# Patient Record
Sex: Female | Born: 1945 | Race: White | Hispanic: No | Marital: Single | Smoking: Former smoker
Health system: Southern US, Community
[De-identification: ages and names within clinical notes are randomized; demographics above are authoritative.]

## PROBLEM LIST (undated history)

## (undated) DIAGNOSIS — J449 Chronic obstructive pulmonary disease, unspecified: Secondary | ICD-10-CM

## (undated) DIAGNOSIS — J45909 Unspecified asthma, uncomplicated: Secondary | ICD-10-CM

---

## 2019-01-11 ENCOUNTER — Other Ambulatory Visit: Payer: Self-pay

## 2019-01-11 DIAGNOSIS — Z20822 Contact with and (suspected) exposure to covid-19: Secondary | ICD-10-CM

## 2019-01-13 LAB — NOVEL CORONAVIRUS, NAA: SARS-CoV-2, NAA: NOT DETECTED

## 2019-01-19 ENCOUNTER — Inpatient Hospital Stay (HOSPITAL_COMMUNITY)
Admission: EM | Admit: 2019-01-19 | Discharge: 2019-01-31 | DRG: 177 | Disposition: E | Payer: Medicare Other | Attending: Internal Medicine | Admitting: Internal Medicine

## 2019-01-19 ENCOUNTER — Emergency Department (HOSPITAL_COMMUNITY): Payer: Medicare Other

## 2019-01-19 ENCOUNTER — Encounter (HOSPITAL_COMMUNITY): Payer: Self-pay

## 2019-01-19 DIAGNOSIS — J431 Panlobular emphysema: Secondary | ICD-10-CM | POA: Diagnosis not present

## 2019-01-19 DIAGNOSIS — J690 Pneumonitis due to inhalation of food and vomit: Secondary | ICD-10-CM | POA: Diagnosis present

## 2019-01-19 DIAGNOSIS — I4891 Unspecified atrial fibrillation: Secondary | ICD-10-CM | POA: Diagnosis present

## 2019-01-19 DIAGNOSIS — J1282 Pneumonia due to coronavirus disease 2019: Secondary | ICD-10-CM | POA: Diagnosis present

## 2019-01-19 DIAGNOSIS — Z681 Body mass index (BMI) 19 or less, adult: Secondary | ICD-10-CM | POA: Diagnosis not present

## 2019-01-19 DIAGNOSIS — J9601 Acute respiratory failure with hypoxia: Secondary | ICD-10-CM | POA: Diagnosis not present

## 2019-01-19 DIAGNOSIS — G9341 Metabolic encephalopathy: Secondary | ICD-10-CM | POA: Diagnosis not present

## 2019-01-19 DIAGNOSIS — Z66 Do not resuscitate: Secondary | ICD-10-CM | POA: Diagnosis not present

## 2019-01-19 DIAGNOSIS — Z8249 Family history of ischemic heart disease and other diseases of the circulatory system: Secondary | ICD-10-CM | POA: Diagnosis not present

## 2019-01-19 DIAGNOSIS — R0602 Shortness of breath: Secondary | ICD-10-CM | POA: Diagnosis present

## 2019-01-19 DIAGNOSIS — Z87891 Personal history of nicotine dependence: Secondary | ICD-10-CM | POA: Diagnosis not present

## 2019-01-19 DIAGNOSIS — J069 Acute upper respiratory infection, unspecified: Secondary | ICD-10-CM | POA: Diagnosis not present

## 2019-01-19 DIAGNOSIS — E860 Dehydration: Secondary | ICD-10-CM | POA: Diagnosis present

## 2019-01-19 DIAGNOSIS — U071 COVID-19: Secondary | ICD-10-CM | POA: Diagnosis present

## 2019-01-19 DIAGNOSIS — J430 Unilateral pulmonary emphysema [MacLeod's syndrome]: Secondary | ICD-10-CM | POA: Diagnosis not present

## 2019-01-19 DIAGNOSIS — E876 Hypokalemia: Secondary | ICD-10-CM | POA: Diagnosis not present

## 2019-01-19 DIAGNOSIS — E440 Moderate protein-calorie malnutrition: Secondary | ICD-10-CM | POA: Diagnosis present

## 2019-01-19 DIAGNOSIS — Z515 Encounter for palliative care: Secondary | ICD-10-CM | POA: Diagnosis not present

## 2019-01-19 DIAGNOSIS — R7303 Prediabetes: Secondary | ICD-10-CM | POA: Diagnosis present

## 2019-01-19 DIAGNOSIS — J449 Chronic obstructive pulmonary disease, unspecified: Secondary | ICD-10-CM | POA: Diagnosis not present

## 2019-01-19 DIAGNOSIS — R0902 Hypoxemia: Secondary | ICD-10-CM

## 2019-01-19 DIAGNOSIS — B370 Candidal stomatitis: Secondary | ICD-10-CM | POA: Diagnosis present

## 2019-01-19 DIAGNOSIS — J1289 Other viral pneumonia: Secondary | ICD-10-CM | POA: Diagnosis present

## 2019-01-19 DIAGNOSIS — J80 Acute respiratory distress syndrome: Secondary | ICD-10-CM | POA: Diagnosis not present

## 2019-01-19 DIAGNOSIS — G92 Toxic encephalopathy: Secondary | ICD-10-CM | POA: Diagnosis not present

## 2019-01-19 DIAGNOSIS — J440 Chronic obstructive pulmonary disease with acute lower respiratory infection: Secondary | ICD-10-CM | POA: Diagnosis present

## 2019-01-19 DIAGNOSIS — E7800 Pure hypercholesterolemia, unspecified: Secondary | ICD-10-CM | POA: Diagnosis not present

## 2019-01-19 DIAGNOSIS — E785 Hyperlipidemia, unspecified: Secondary | ICD-10-CM | POA: Diagnosis present

## 2019-01-19 DIAGNOSIS — G928 Other toxic encephalopathy: Secondary | ICD-10-CM | POA: Diagnosis present

## 2019-01-19 DIAGNOSIS — Z825 Family history of asthma and other chronic lower respiratory diseases: Secondary | ICD-10-CM

## 2019-01-19 DIAGNOSIS — E870 Hyperosmolality and hypernatremia: Secondary | ICD-10-CM | POA: Diagnosis not present

## 2019-01-19 DIAGNOSIS — E872 Acidosis: Secondary | ICD-10-CM | POA: Diagnosis present

## 2019-01-19 DIAGNOSIS — R0600 Dyspnea, unspecified: Secondary | ICD-10-CM

## 2019-01-19 DIAGNOSIS — E871 Hypo-osmolality and hyponatremia: Secondary | ICD-10-CM | POA: Diagnosis present

## 2019-01-19 HISTORY — DX: Unspecified asthma, uncomplicated: J45.909

## 2019-01-19 HISTORY — DX: Chronic obstructive pulmonary disease, unspecified: J44.9

## 2019-01-19 LAB — CBC WITH DIFFERENTIAL/PLATELET
Abs Immature Granulocytes: 0.08 10*3/uL — ABNORMAL HIGH (ref 0.00–0.07)
Basophils Absolute: 0 10*3/uL (ref 0.0–0.1)
Basophils Relative: 0 %
Eosinophils Absolute: 0 10*3/uL (ref 0.0–0.5)
Eosinophils Relative: 0 %
HCT: 41.9 % (ref 36.0–46.0)
Hemoglobin: 14.1 g/dL (ref 12.0–15.0)
Immature Granulocytes: 1 %
Lymphocytes Relative: 11 %
Lymphs Abs: 1 10*3/uL (ref 0.7–4.0)
MCH: 31.1 pg (ref 26.0–34.0)
MCHC: 33.7 g/dL (ref 30.0–36.0)
MCV: 92.3 fL (ref 80.0–100.0)
Monocytes Absolute: 0.3 10*3/uL (ref 0.1–1.0)
Monocytes Relative: 4 %
Neutro Abs: 7.6 10*3/uL (ref 1.7–7.7)
Neutrophils Relative %: 84 %
Platelets: 180 10*3/uL (ref 150–400)
RBC: 4.54 MIL/uL (ref 3.87–5.11)
RDW: 14.6 % (ref 11.5–15.5)
WBC: 9 10*3/uL (ref 4.0–10.5)
nRBC: 0 % (ref 0.0–0.2)

## 2019-01-19 LAB — BLOOD GAS, ARTERIAL
Acid-base deficit: 8.3 mmol/L — ABNORMAL HIGH (ref 0.0–2.0)
Bicarbonate: 15.6 mmol/L — ABNORMAL LOW (ref 20.0–28.0)
O2 Saturation: 85.7 %
Patient temperature: 97.7
pCO2 arterial: 27.9 mmHg — ABNORMAL LOW (ref 32.0–48.0)
pH, Arterial: 7.364 (ref 7.350–7.450)
pO2, Arterial: 53.4 mmHg — ABNORMAL LOW (ref 83.0–108.0)

## 2019-01-19 LAB — FIBRINOGEN: Fibrinogen: 528 mg/dL — ABNORMAL HIGH (ref 210–475)

## 2019-01-19 LAB — COMPREHENSIVE METABOLIC PANEL
ALT: 63 U/L — ABNORMAL HIGH (ref 0–44)
AST: 83 U/L — ABNORMAL HIGH (ref 15–41)
Albumin: 3 g/dL — ABNORMAL LOW (ref 3.5–5.0)
Alkaline Phosphatase: 79 U/L (ref 38–126)
Anion gap: 14 (ref 5–15)
BUN: 8 mg/dL (ref 8–23)
CO2: 17 mmol/L — ABNORMAL LOW (ref 22–32)
Calcium: 8.4 mg/dL — ABNORMAL LOW (ref 8.9–10.3)
Chloride: 102 mmol/L (ref 98–111)
Creatinine, Ser: 0.92 mg/dL (ref 0.44–1.00)
GFR calc Af Amer: >60 mL/min (ref >60)
GFR calc non Af Amer: >60 mL/min (ref >60)
Glucose, Bld: 103 mg/dL — ABNORMAL HIGH (ref 70–99)
Potassium: 4 mmol/L (ref 3.5–5.1)
Sodium: 133 mmol/L — ABNORMAL LOW (ref 135–145)
Total Bilirubin: 0.5 mg/dL (ref 0.3–1.2)
Total Protein: 6.6 g/dL (ref 6.5–8.1)

## 2019-01-19 LAB — C-REACTIVE PROTEIN: CRP: 10.9 mg/dL — ABNORMAL HIGH (ref <1.0)

## 2019-01-19 LAB — D-DIMER, QUANTITATIVE: D-Dimer, Quant: 1.15 ug/mL-FEU — ABNORMAL HIGH (ref 0.00–0.50)

## 2019-01-19 LAB — FERRITIN: Ferritin: 777 ng/mL — ABNORMAL HIGH (ref 11–307)

## 2019-01-19 LAB — LACTIC ACID, PLASMA
Lactic Acid, Venous: 1.6 mmol/L (ref 0.5–1.9)
Lactic Acid, Venous: 2 mmol/L (ref 0.5–1.9)

## 2019-01-19 LAB — TRIGLYCERIDES: Triglycerides: 119 mg/dL (ref <150)

## 2019-01-19 LAB — PROCALCITONIN: Procalcitonin: 1.21 ng/mL

## 2019-01-19 LAB — LACTATE DEHYDROGENASE: LDH: 386 U/L — ABNORMAL HIGH (ref 98–192)

## 2019-01-19 LAB — TROPONIN I (HIGH SENSITIVITY): Troponin I (High Sensitivity): 9 ng/L (ref <18)

## 2019-01-19 MED ORDER — SODIUM CHLORIDE 0.9 % IV SOLN
1000.0000 mL | INTRAVENOUS | Status: DC
  Administered 2019-01-19: 1000 mL via INTRAVENOUS

## 2019-01-19 MED ORDER — DEXAMETHASONE SODIUM PHOSPHATE 10 MG/ML IJ SOLN
10.0000 mg | Freq: Once | INTRAMUSCULAR | Status: AC
  Administered 2019-01-19: 10 mg via INTRAVENOUS
  Filled 2019-01-19: qty 1

## 2019-01-19 MED ORDER — UMECLIDINIUM BROMIDE 62.5 MCG/INH IN AEPB
1.0000 | INHALATION_SPRAY | Freq: Every day | RESPIRATORY_TRACT | Status: DC
  Administered 2019-01-20: 1 via RESPIRATORY_TRACT
  Filled 2019-01-19: qty 7

## 2019-01-19 MED ORDER — MONTELUKAST SODIUM 10 MG PO TABS
10.0000 mg | ORAL_TABLET | Freq: Every day | ORAL | Status: DC
  Administered 2019-01-20 – 2019-01-22 (×3): 10 mg via ORAL
  Filled 2019-01-19 (×4): qty 1

## 2019-01-19 MED ORDER — GUAIFENESIN-DM 100-10 MG/5ML PO SYRP: 10.0000 mL | ORAL_SOLUTION | ORAL | Status: DC | PRN

## 2019-01-19 MED ORDER — NYSTATIN 100000 UNIT/ML MT SUSP: 15.0000 mL | OROMUCOSAL | Status: DC

## 2019-01-19 MED ORDER — ATORVASTATIN CALCIUM 40 MG PO TABS
40.0000 mg | ORAL_TABLET | Freq: Every day | ORAL | Status: DC
  Administered 2019-01-20 – 2019-01-22 (×3): 40 mg via ORAL
  Filled 2019-01-19 (×3): qty 1

## 2019-01-19 MED ORDER — FLUTICASONE FUROATE-VILANTEROL 200-25 MCG/INH IN AEPB
1.0000 | INHALATION_SPRAY | Freq: Every day | RESPIRATORY_TRACT | Status: DC
  Administered 2019-01-20 – 2019-01-28 (×4): 1 via RESPIRATORY_TRACT
  Filled 2019-01-19: qty 28

## 2019-01-19 MED ORDER — DEXAMETHASONE SODIUM PHOSPHATE 10 MG/ML IJ SOLN
6.0000 mg | INTRAMUSCULAR | Status: DC
  Administered 2019-01-20 – 2019-01-22 (×3): 6 mg via INTRAVENOUS
  Filled 2019-01-19 (×3): qty 1

## 2019-01-19 MED ORDER — SODIUM CHLORIDE 0.9 % IV SOLN
2.0000 g | INTRAVENOUS | Status: DC
  Administered 2019-01-19 – 2019-01-22 (×4): 2 g via INTRAVENOUS
  Filled 2019-01-19 (×4): qty 20

## 2019-01-19 MED ORDER — ALBUTEROL SULFATE HFA 108 (90 BASE) MCG/ACT IN AERS
2.0000 | INHALATION_SPRAY | RESPIRATORY_TRACT | Status: DC | PRN
  Administered 2019-01-20: 4 via RESPIRATORY_TRACT
  Administered 2019-01-21 (×2): 2 via RESPIRATORY_TRACT
  Filled 2019-01-19: qty 6.7

## 2019-01-19 MED ORDER — ZINC SULFATE 220 (50 ZN) MG PO CAPS
220.0000 mg | ORAL_CAPSULE | Freq: Every day | ORAL | Status: DC
  Administered 2019-01-20 – 2019-01-29 (×7): 220 mg via ORAL
  Filled 2019-01-19 (×8): qty 1

## 2019-01-19 MED ORDER — VITAMIN C 500 MG PO TABS
500.0000 mg | ORAL_TABLET | Freq: Every day | ORAL | Status: DC
  Administered 2019-01-20 – 2019-01-29 (×7): 500 mg via ORAL
  Filled 2019-01-19 (×8): qty 1

## 2019-01-19 MED ORDER — ENOXAPARIN SODIUM 40 MG/0.4ML ~~LOC~~ SOLN
40.0000 mg | Freq: Every day | SUBCUTANEOUS | Status: DC
  Administered 2019-01-20 – 2019-01-21 (×2): 40 mg via SUBCUTANEOUS
  Filled 2019-01-19 (×3): qty 0.4

## 2019-01-19 MED ORDER — ACETAMINOPHEN 325 MG PO TABS: 650.0000 mg | ORAL_TABLET | Freq: Four times a day (QID) | ORAL | Status: DC | PRN

## 2019-01-19 MED ORDER — HYDROCOD POLST-CPM POLST ER 10-8 MG/5ML PO SUER
5.0000 mL | Freq: Two times a day (BID) | ORAL | Status: DC | PRN
  Administered 2019-01-21: 5 mL via ORAL
  Filled 2019-01-19: qty 5

## 2019-01-19 MED ORDER — TIOTROPIUM BROMIDE MONOHYDRATE 2.5 MCG/ACT IN AERS: 2.0000 | INHALATION_SPRAY | Freq: Every day | RESPIRATORY_TRACT | Status: DC

## 2019-01-19 MED ORDER — ALBUTEROL SULFATE HFA 108 (90 BASE) MCG/ACT IN AERS
4.0000 | INHALATION_SPRAY | Freq: Once | RESPIRATORY_TRACT | Status: AC
  Administered 2019-01-19: 4 via RESPIRATORY_TRACT
  Filled 2019-01-19: qty 6.7

## 2019-01-19 MED ORDER — SODIUM CHLORIDE 0.9 % IV SOLN
500.0000 mg | INTRAVENOUS | Status: DC
  Administered 2019-01-19 – 2019-01-20 (×2): 500 mg via INTRAVENOUS
  Filled 2019-01-19 (×2): qty 500

## 2019-01-19 MED ORDER — SODIUM CHLORIDE 0.9 % IV BOLUS
500.0000 mL | Freq: Once | INTRAVENOUS | Status: AC
  Administered 2019-01-19: 500 mL via INTRAVENOUS

## 2019-01-19 NOTE — ED Notes (Signed)
Pt daughter called for an update, daughter stated pt has been taking spiriva and albuterol for COVID symptoms and has oral thrush. Daughter stated she has been using mouthwash to treat thrush.

## 2019-01-19 NOTE — ED Triage Notes (Signed)
Per EMS: Pt from home.  Pt tested positive for COVID on Sunday.  Pt c/o of SOB x2 days. Pt has had cough since Sunday.  Pt hx of COPD and asthma.  Daughter states pt has been more confused.  Pt satting 74% RA on arrival.  Pt placed on non-rebreather.  Pt now 90-93%.   BP 131/53 HR 78 RR 32 O2 95% non-rebreather CBG 156

## 2019-01-19 NOTE — ED Notes (Signed)
Spoke with Dr. Marlowe Sax, verifying Lactic of 2.0.

## 2019-01-19 NOTE — H&P (Addendum)
History and Physical    Ashley Cook MGQ:[id number] DOB: Aug 24, 1945 DOA: 01/27/2019  PCP: System, Pcp Not In Patient coming from: Home  Chief Complaint: COVID-19 positive  HPI: Ashley Cook is a 73 y.o. female with medical history significant of asthma, COPD presenting to the hospital with complaints of shortness of breath and cough.  Patient tested positive for COVID-19 on 11/15.  Complaining of shortness of breath, cough, fatigue, and body aches.  She is not sure when the symptoms started.  Denies chest pain, nausea, vomiting, abdominal pain, or diarrhea.  No additional history could be obtained from her.  ED Course: Oxygen saturation 74% on room air on arrival, improved with 15 L oxygen via nonrebreather.  Tachypneic with respiratory rate up to 30s.  Afebrile and no leukocytosis.  Lactic acid normal.  Bicarb 17.  AST 83, ALT 63.  Alk phos and T bili normal.  Blood culture x2 pending.  Inflammatory markers elevated: Ferritin 777, CRP 10.9, D-dimer 1.15, LDH 386, fibrinogen 528.  Procalcitonin 1.21.  Chest x-ray showing multifocal airspace opacities concerning for COVID-19 pneumonia.  EKG showing diffuse T wave inversions, no prior tracing for comparison. Patient received albuterol MDI treatment, Decadron 10 mg, ceftriaxone, azithromycin, and IV fluid.  Review of Systems:  All systems reviewed and apart from history of presenting illness, are negative.  Past Medical History:  Diagnosis Date  . Asthma   . COPD (chronic obstructive pulmonary disease) ([place])     History reviewed. No pertinent surgical history.   reports that she has quit smoking. Her smoking use included cigarettes. She quit after 25.00 years of use. She has never used smokeless tobacco. She reports that she does not drink alcohol or use drugs.  No Known Allergies  Family History  Problem Relation Age of Onset  . Asthma Mother   . Heart disease Father     Prior to Admission medications   Not on File     Physical Exam: Vitals:   01/20/19 0530 01/20/19 0600 01/20/19 0630 01/20/19 0700  BP: 127/61 126/64 (!) 131/52 (!) 135/59  Pulse: 76 71 70 73  Resp: (!) 28 (!) 28 (!) 23 (!) 23  Temp:      TempSrc:      SpO2: 96% 96% 98% 98%  Weight:      Height:        Physical Exam  Constitutional: She is oriented to person, place, and time. She appears well-developed and well-nourished. No distress.  HENT:  Head: Normocephalic.  Eyes: Right eye exhibits no discharge. Left eye exhibits no discharge.  Neck: Neck supple.  Cardiovascular: Normal rate, regular rhythm and intact distal pulses.  Pulmonary/Chest: She is in respiratory distress. She has no wheezes. She has no rales.  Slightly tachypneic Oxygen saturation 90-92% on 6 L supplemental oxygen via nasal cannula  Abdominal: Soft. Bowel sounds are normal. She exhibits no distension. There is no abdominal tenderness. There is no guarding.  Musculoskeletal:        General: No edema.  Neurological: She is alert and oriented to person, place, and time.  Skin: Skin is warm and dry. She is not diaphoretic.     Labs on Admission: I have personally reviewed following labs and imaging studies  CBC: Recent Labs  Lab 01/08/2019 1938 01/20/19 0511  WBC 9.0 10.3  NEUTROABS 7.6 9.3*  HGB 14.1 12.7  HCT 41.9 38.3  MCV 92.3 92.3  PLT 180 267   Basic Metabolic Panel: Recent Labs  Lab 01/18/2019 1938 01/20/19  0511  NA 133* 139  K 4.0 4.7  CL 102 110  CO2 17* 16*  GLUCOSE 103* 168*  BUN 8 7*  CREATININE 0.92 0.83  CALCIUM 8.4* 7.8*   GFR: Estimated Creatinine Clearance: 44.7 mL/min (by C-G formula based on SCr of 0.83 mg/dL). Liver Function Tests: Recent Labs  Lab 01/07/2019 1938 01/20/19 0511  AST 83* 66*  ALT 63* 51*  ALKPHOS 79 71  BILITOT 0.5 0.4  PROT 6.6 5.8*  ALBUMIN 3.0* 2.5*   No results for input(s): LIPASE, AMYLASE in the last 168 hours. No results for input(s): AMMONIA in the last 168 hours. Coagulation Profile: No  results for input(s): INR, PROTIME in the last 168 hours. Cardiac Enzymes: No results for input(s): CKTOTAL, CKMB, CKMBINDEX, TROPONINI in the last 168 hours. BNP (last 3 results) No results for input(s): PROBNP in the last 8760 hours. HbA1C: No results for input(s): HGBA1C in the last 72 hours. CBG: No results for input(s): GLUCAP in the last 168 hours. Lipid Profile: Recent Labs    01/01/2019 1934  TRIG 119   Thyroid Function Tests: No results for input(s): TSH, T4TOTAL, FREET4, T3FREE, THYROIDAB in the last 72 hours. Anemia Panel: Recent Labs    01/05/2019 1934  FERRITIN 777*   Urine analysis: No results found for: COLORURINE, APPEARANCEUR, LABSPEC, PHURINE, GLUCOSEU, HGBUR, BILIRUBINUR, KETONESUR, PROTEINUR, UROBILINOGEN, NITRITE, LEUKOCYTESUR  Radiological Exams on Admission: Dg Chest Port 1 View  Result Date: 01/23/2019 CLINICAL DATA:  Shortness breath, positive COVID-19 on Sunday EXAM: PORTABLE CHEST 1 VIEW COMPARISON:  None. FINDINGS: Combined interstitial and airspace opacities most pronounced throughout the right lung and in the left lung base. No pneumothorax. No effusion. The cardiomediastinal contours are unremarkable. No acute osseous or soft tissue abnormality. IMPRESSION: Appearance most concerning for a multifocal pneumonia in the setting of known COVID-19 positivity. Electronically Signed   By: Lovena Le M.D.   On: 01/10/2019 20:11    EKG: Independently reviewed.  Sinus rhythm, diffuse T wave inversions.  No prior tracing for comparison.  Assessment/Plan Principal Problem:   Pneumonia due to COVID-19 virus Active Problems:   Acute respiratory failure with hypoxia (HCC)   HLD (hyperlipidemia)   COPD (chronic obstructive pulmonary disease) (HCC)   Acute hypoxemic respiratory failure secondary to COVID-19 multifocal pneumonia Tested positive for COVID-19 on 11/15.  Oxygen saturation 74% on room air.  Placed on 6 L supplemental oxygen via nasal cannula in the  ED.  ABG reflected moderate hypoxemia.  Placed on 15 L supplemental oxygen via nonrebreather.  Tachypneic with respiratory rate in the 20s to 30s. Afebrile and no leukocytosis.  Lactic acid borderline elevated, suspect related to dehydration as it improved with a small fluid bolus.  Chest x-ray showing multifocal airspace opacities concerning for COVID-19 pneumonia.  Inflammatory markers elevated: Ferritin 777, CRP 10.9, D-dimer 1.15, LDH 386, fibrinogen 528.  Transaminases mildly elevated.?Possible bacterial superinfection -  procalcitonin 1.21. -Remdesivir -IV Decadron 6 mg daily -Continue ceftriaxone and azithromycin -Antitussives as needed -Tylenol as needed -Vitamin C and zinc -Continuous pulse ox -Supplemental oxygen to keep oxygen saturation above 90% -Blood culture x2 pending -Daily CBC with differential, CMP, LDH, CRP, D-dimer -Goal is to maintain euvolemia to net negative fluid balance -Lovenox for VT prophylaxis  Addendum: Repeat ABG with improvement of hypoxemia.  Abnormal EKG EKG showing diffuse T wave inversions, no prior tracing for comparison.  High-sensitivity troponin negative.  Patient denies chest pain.  Asthma, COPD -Stable.  Not wheezing. -Continue home Singulair and inhalers  Hyperlipidemia -Continue statin   DVT prophylaxis: Lovenox Code Status: Patient wishes to be full code. Family Communication: No family available. Disposition Plan: Anticipate discharge after clinical improvement. Consults called: None Admission status: It is my clinical opinion that admission to INPATIENT is reasonable and necessary in this 73 y.o. female . presenting with acute hypoxic respiratory failure secondary to COVID-19 viral pneumonia.  Requiring 15 L oxygen via nonrebreather.  Very high risk of decompensation.  Given the aforementioned, the predictability of an adverse outcome is felt to be significant. I expect that the patient will require at least 2 midnights in the hospital  to treat this condition.   The medical decision making on this patient was of high complexity and the patient is at high risk for clinical deterioration, therefore this is a level 3 visit.  Shela Leff MD [facility] Pager [phone]  If 7PM-7AM, please contact night-coverage [email or web address] Password [facility]  01/20/2019, 7:36 AM

## 2019-01-19 NOTE — ED Notes (Signed)
Date and time results received: Jan 28, 2019 2225 (use smartphrase ".now" to insert current time)  Test: Lactic Acid Critical Value: 2.0  Name of Provider Notified: Marlowe Sax, MD  Orders Received? Or Actions Taken?: Orders Received - See Orders for details

## 2019-01-19 NOTE — ED Provider Notes (Signed)
[facility] DEPT Provider Note   CSN: [id number] Arrival date & time: 01/29/2019  1828     History   Chief Complaint No chief complaint on file.   HPI Ashley Cook is a 73 y.o. female.     73 year old female recently diagnosed with Covid who presents with worsening shortness of breath times several days.  He denies any fever or chills.  Has had diffuse body weakness.  No vomiting or diarrhea.  No leg pain or swelling.  No pleuritic chest pain.  Does have underlying history of COPD due to tobacco use.  Denies using tobacco currently.  Symptoms have been progressively worse and are exertional and better with rest.  No treatment use prior to arrival     No past medical history on file.  There are no active problems to display for this patient.      OB History   No obstetric history on file.      Home Medications    Prior to Admission medications   Not on File    Family History No family history on file.  Social History Social History   Tobacco Use  . Smoking status: Not on file  Substance Use Topics  . Alcohol use: Not on file  . Drug use: Not on file     Allergies   Patient has no allergy information on record.   Review of Systems Review of Systems  All other systems reviewed and are negative.    Physical Exam Updated Vital Signs There were no vitals taken for this visit.  Physical Exam Vitals signs and nursing note reviewed.  Constitutional:      General: She is not in acute distress.    Appearance: Normal appearance. She is well-developed. She is not toxic-appearing.  HENT:     Head: Normocephalic and atraumatic.  Eyes:     General: Lids are normal.     Conjunctiva/sclera: Conjunctivae normal.     Pupils: Pupils are equal, round, and reactive to light.  Neck:     Musculoskeletal: Normal range of motion and neck supple.     Thyroid: No thyroid mass.     Trachea: No tracheal deviation.   Cardiovascular:     Rate and Rhythm: Normal rate and regular rhythm.     Heart sounds: Normal heart sounds. No murmur. No gallop.   Pulmonary:     Effort: Tachypnea and respiratory distress present.     Breath sounds: No stridor. Decreased breath sounds present. No wheezing, rhonchi or rales.  Abdominal:     General: Bowel sounds are normal. There is no distension.     Palpations: Abdomen is soft.     Tenderness: There is no abdominal tenderness. There is no rebound.  Musculoskeletal: Normal range of motion.        General: No tenderness.  Skin:    General: Skin is warm and dry.     Findings: No abrasion or rash.  Neurological:     Mental Status: She is alert and oriented to person, place, and time.     GCS: GCS eye subscore is 4. GCS verbal subscore is 5. GCS motor subscore is 6.     Cranial Nerves: No cranial nerve deficit.     Sensory: No sensory deficit.  Psychiatric:        Speech: Speech normal.        Behavior: Behavior normal.      ED Treatments / Results  Labs (all labs ordered  are listed, but only abnormal results are displayed) Labs Reviewed  CULTURE, BLOOD (ROUTINE X 2)  CULTURE, BLOOD (ROUTINE X 2)  LACTIC ACID, PLASMA  LACTIC ACID, PLASMA  CBC WITH DIFFERENTIAL/PLATELET  COMPREHENSIVE METABOLIC PANEL  D-DIMER, QUANTITATIVE (NOT AT [facility])  PROCALCITONIN  LACTATE DEHYDROGENASE  FERRITIN  TRIGLYCERIDES  FIBRINOGEN  C-REACTIVE PROTEIN    EKG EKG Interpretation  Date/Time:  Thursday January 19 2019 20:16:54 EST Ventricular Rate:  81 PR Interval:    QRS Duration: 95 QT Interval:  384 QTC Calculation: 446 R Axis:   60 Text Interpretation: Sinus rhythm Abnormal T, consider ischemia, anterior leads No old tracing to compare Confirmed by Lorre Nick ([id number]) on 01/06/2019 8:31:50 PM   Radiology No results found.  Procedures Procedures (including critical care time)  Medications Ordered in ED Medications  0.9 %  sodium chloride infusion (has no  administration in time range)     Initial Impression / Assessment and Plan / ED Course  I have reviewed the triage vital signs and the nursing notes.  Pertinent labs & imaging results that were available during my care of the patient were reviewed by me and considered in my medical decision making (see chart for details).        Patient's chest x-ray shows multifocal pneumonia will be started on IV antibiotics.  Will admit to the hospitalist service  Ashley Cook was evaluated in Emergency Department on 01/02/2019 for the symptoms described in the history of present illness. She was evaluated in the context of the global COVID-19 pandemic, which necessitated consideration that the patient might be at risk for infection with the SARS-CoV-2 virus that causes COVID-19. Institutional protocols and algorithms that pertain to the evaluation of patients at risk for COVID-19 are in a state of rapid change based on information released by regulatory bodies including the CDC and federal and state organizations. These policies and algorithms were followed during the patient's care in the ED.  CRITICAL CARE Performed by: Toy Baker Total critical care time: 55 minutes Critical care time was exclusive of separately billable procedures and treating other patients. Critical care was necessary to treat or prevent imminent or life-threatening deterioration. Critical care was time spent personally by me on the following activities: development of treatment plan with patient and/or surrogate as well as nursing, discussions with consultants, evaluation of patient's response to treatment, examination of patient, obtaining history from patient or surrogate, ordering and performing treatments and interventions, ordering and review of laboratory studies, ordering and review of radiographic studies, pulse oximetry and re-evaluation of patient's condition.  Final Clinical Impressions(s) / ED Diagnoses    Final diagnoses:  None    ED Discharge Orders    None       Lorre Nick, MD 01/26/2019 2033

## 2019-01-20 ENCOUNTER — Other Ambulatory Visit: Payer: Self-pay

## 2019-01-20 DIAGNOSIS — U071 COVID-19: Secondary | ICD-10-CM | POA: Diagnosis not present

## 2019-01-20 DIAGNOSIS — J449 Chronic obstructive pulmonary disease, unspecified: Secondary | ICD-10-CM | POA: Diagnosis not present

## 2019-01-20 DIAGNOSIS — J069 Acute upper respiratory infection, unspecified: Secondary | ICD-10-CM

## 2019-01-20 DIAGNOSIS — J9601 Acute respiratory failure with hypoxia: Secondary | ICD-10-CM | POA: Diagnosis present

## 2019-01-20 DIAGNOSIS — E785 Hyperlipidemia, unspecified: Secondary | ICD-10-CM | POA: Diagnosis present

## 2019-01-20 LAB — BLOOD GAS, ARTERIAL
Acid-base deficit: 8 mmol/L — ABNORMAL HIGH (ref 0.0–2.0)
Bicarbonate: 15.8 mmol/L — ABNORMAL LOW (ref 20.0–28.0)
FIO2: 100
O2 Saturation: 90.3 %
Patient temperature: 97.7
pCO2 arterial: 28 mmHg — ABNORMAL LOW (ref 32.0–48.0)
pH, Arterial: 7.368 (ref 7.350–7.450)
pO2, Arterial: 60.3 mmHg — ABNORMAL LOW (ref 83.0–108.0)

## 2019-01-20 LAB — CBC WITH DIFFERENTIAL/PLATELET
Abs Immature Granulocytes: 0.16 10*3/uL — ABNORMAL HIGH (ref 0.00–0.07)
Basophils Absolute: 0 10*3/uL (ref 0.0–0.1)
Basophils Relative: 0 %
Eosinophils Absolute: 0 10*3/uL (ref 0.0–0.5)
Eosinophils Relative: 0 %
HCT: 38.3 % (ref 36.0–46.0)
Hemoglobin: 12.7 g/dL (ref 12.0–15.0)
Immature Granulocytes: 2 %
Lymphocytes Relative: 6 %
Lymphs Abs: 0.7 10*3/uL (ref 0.7–4.0)
MCH: 30.6 pg (ref 26.0–34.0)
MCHC: 33.2 g/dL (ref 30.0–36.0)
MCV: 92.3 fL (ref 80.0–100.0)
Monocytes Absolute: 0.2 10*3/uL (ref 0.1–1.0)
Monocytes Relative: 2 %
Neutro Abs: 9.3 10*3/uL — ABNORMAL HIGH (ref 1.7–7.7)
Neutrophils Relative %: 90 %
Platelets: 175 10*3/uL (ref 150–400)
RBC: 4.15 MIL/uL (ref 3.87–5.11)
RDW: 14.7 % (ref 11.5–15.5)
WBC: 10.3 10*3/uL (ref 4.0–10.5)
nRBC: 0 % (ref 0.0–0.2)

## 2019-01-20 LAB — COMPREHENSIVE METABOLIC PANEL
ALT: 51 U/L — ABNORMAL HIGH (ref 0–44)
AST: 66 U/L — ABNORMAL HIGH (ref 15–41)
Albumin: 2.5 g/dL — ABNORMAL LOW (ref 3.5–5.0)
Alkaline Phosphatase: 71 U/L (ref 38–126)
Anion gap: 13 (ref 5–15)
BUN: 7 mg/dL — ABNORMAL LOW (ref 8–23)
CO2: 16 mmol/L — ABNORMAL LOW (ref 22–32)
Calcium: 7.8 mg/dL — ABNORMAL LOW (ref 8.9–10.3)
Chloride: 110 mmol/L (ref 98–111)
Creatinine, Ser: 0.83 mg/dL (ref 0.44–1.00)
GFR calc Af Amer: >60 mL/min (ref >60)
GFR calc non Af Amer: >60 mL/min (ref >60)
Glucose, Bld: 168 mg/dL — ABNORMAL HIGH (ref 70–99)
Potassium: 4.7 mmol/L (ref 3.5–5.1)
Sodium: 139 mmol/L (ref 135–145)
Total Bilirubin: 0.4 mg/dL (ref 0.3–1.2)
Total Protein: 5.8 g/dL — ABNORMAL LOW (ref 6.5–8.1)

## 2019-01-20 LAB — FIBRINOGEN: Fibrinogen: 545 mg/dL — ABNORMAL HIGH (ref 210–475)

## 2019-01-20 LAB — SEDIMENTATION RATE: Sed Rate: 18 mm/hr (ref 0–22)

## 2019-01-20 LAB — LACTIC ACID, PLASMA: Lactic Acid, Venous: 1.5 mmol/L (ref 0.5–1.9)

## 2019-01-20 LAB — PROCALCITONIN: Procalcitonin: 0.62 ng/mL

## 2019-01-20 LAB — SARS CORONAVIRUS 2 (TAT 6-24 HRS): SARS Coronavirus 2: POSITIVE — AB

## 2019-01-20 LAB — C-REACTIVE PROTEIN: CRP: 13 mg/dL — ABNORMAL HIGH (ref <1.0)

## 2019-01-20 LAB — FERRITIN: Ferritin: 843 ng/mL — ABNORMAL HIGH (ref 11–307)

## 2019-01-20 LAB — LACTATE DEHYDROGENASE: LDH: 345 U/L — ABNORMAL HIGH (ref 98–192)

## 2019-01-20 LAB — ABO/RH: ABO/RH(D): O NEG

## 2019-01-20 LAB — D-DIMER, QUANTITATIVE: D-Dimer, Quant: 1.25 ug/mL-FEU — ABNORMAL HIGH (ref 0.00–0.50)

## 2019-01-20 MED ORDER — SODIUM CHLORIDE 0.9 % IV SOLN
200.0000 mg | Freq: Once | INTRAVENOUS | Status: AC
  Administered 2019-01-20: 200 mg via INTRAVENOUS
  Filled 2019-01-20: qty 40

## 2019-01-20 MED ORDER — SODIUM CHLORIDE 0.9 % IV SOLN: 100.0000 mg | INTRAVENOUS | Status: DC

## 2019-01-20 MED ORDER — SODIUM CHLORIDE 0.9 % IV SOLN
100.0000 mg | Freq: Every day | INTRAVENOUS | Status: AC
  Administered 2019-01-20 – 2019-01-23 (×4): 100 mg via INTRAVENOUS
  Filled 2019-01-20 (×3): qty 100
  Filled 2019-01-20: qty 20

## 2019-01-20 MED ORDER — FLUCONAZOLE 150 MG PO TABS
150.0000 mg | ORAL_TABLET | Freq: Once | ORAL | Status: AC
  Administered 2019-01-20: 150 mg via ORAL
  Filled 2019-01-20: qty 1

## 2019-01-20 MED ORDER — NYSTATIN 100000 UNIT/ML MT SUSP
5.0000 mL | Freq: Four times a day (QID) | OROMUCOSAL | Status: DC
  Administered 2019-01-20 – 2019-01-29 (×28): 500000 [IU] via ORAL
  Filled 2019-01-20 (×42): qty 5

## 2019-01-20 MED ORDER — IPRATROPIUM-ALBUTEROL 20-100 MCG/ACT IN AERS
1.0000 | INHALATION_SPRAY | Freq: Four times a day (QID) | RESPIRATORY_TRACT | Status: DC
  Administered 2019-01-20 – 2019-01-22 (×9): 1 via RESPIRATORY_TRACT
  Filled 2019-01-20: qty 4

## 2019-01-20 MED ORDER — MAGIC MOUTHWASH W/LIDOCAINE
5.0000 mL | Freq: Four times a day (QID) | ORAL | Status: DC
  Administered 2019-01-20 – 2019-01-27 (×17): 5 mL via ORAL
  Filled 2019-01-20 (×34): qty 5

## 2019-01-20 NOTE — ED Notes (Signed)
Daughter: Charline Bills [phone]

## 2019-01-20 NOTE — ED Notes (Signed)
ED TO INPATIENT HANDOFF REPORT  ED Nurse Name and Phone #:  Victorino Dike [phone] S Name/Age/Gender Ashley Cook 73 y.o. female Room/Bed: WA14/WA14  Code Status   Code Status: Full Code  Home/SNF/Other Home Patient oriented to: self, place, time and situation Is this baseline? Yes   Triage Complete: Triage complete  Chief Complaint Covid   Triage Note Per EMS: Pt from home.  Pt tested positive for COVID on Sunday.  Pt c/o of SOB x2 days. Pt has had cough since Sunday.  Pt hx of COPD and asthma.  Daughter states pt has been more confused.  Pt satting 74% RA on arrival.  Pt placed on non-rebreather.  Pt now 90-93%.   BP 131/53 HR 78 RR 32 O2 95% non-rebreather CBG 156   Allergies No Known Allergies  Level of Care/Admitting Diagnosis ED Disposition    ED Disposition Condition Comment   Admit  Hospital Area: [facility] [[id number]]  Level of Care: Progressive [102]  Covid Evaluation: Confirmed COVID Positive  Diagnosis: Pneumonia due to COVID-19 virus [[id number]]  Admitting Physician: John Giovanni [[id number]]  Attending Physician: John Giovanni [[id number]]  Estimated length of stay: past midnight tomorrow  Certification:: I certify this patient will need inpatient services for at least 2 midnights  PT Class (Do Not Modify): Inpatient [101]  PT Acc Code (Do Not Modify): Private [1]       B Medical/Surgery History Past Medical History:  Diagnosis Date  . Asthma   . COPD (chronic obstructive pulmonary disease) (HCC)    History reviewed. No pertinent surgical history.   A IV Location/Drains/Wounds Patient Lines/Drains/Airways Status   Active Line/Drains/Airways    Name:   Placement date:   Placement time:   Site:   Days:   Peripheral IV 01/08/2019 Left Antecubital   01/15/2019    1934    Antecubital   1   Peripheral IV 01/18/2019 Left Forearm   01/22/2019    1935    Forearm   1          Intake/Output Last 24 hours  Intake/Output  Summary (Last 24 hours) at 01/20/2019 2033 Last data filed at 01/20/2019 1645 Gross per 24 hour  Intake 2100 ml  Output 650 ml  Net 1450 ml    Labs/Imaging Results for orders placed or performed during the hospital encounter of 01/09/2019 (from the past 48 hour(s))  Blood Culture (routine x 2)     Status: None (Preliminary result)   Collection Time: 01/14/2019  7:34 PM   Specimen: BLOOD  Result Value Ref Range   Specimen Description      BLOOD LEFT ANTECUBITAL Performed at [facility], [address] [place], [place] [address]    Special Requests      BOTTLES DRAWN AEROBIC AND ANAEROBIC Blood Culture adequate volume Performed at [facility], [address], [place], [place] [address]    Culture      NO GROWTH < 12 HOURS Performed at [facility], [address], [place], [place] [address]    Report Status PENDING   Blood Culture (routine x 2)     Status: None (Preliminary result)   Collection Time: 01/22/2019  7:34 PM   Specimen: BLOOD LEFT FOREARM  Result Value Ref Range   Specimen Description      BLOOD LEFT FOREARM Performed at [facility], [address] [place], [place] [address]    Special Requests  BOTTLES DRAWN AEROBIC AND ANAEROBIC Blood Culture results may not be optimal due to an inadequate volume of blood received in culture bottles Performed at [facility], [address] [place], [place] [address]    Culture      NO GROWTH < 12 HOURS Performed at [facility], [address] [place], [place] [address]    Report Status PENDING   Ferritin     Status: Abnormal   Collection Time: 01/26/2019  7:34 PM  Result Value Ref Range   Ferritin 777 (H) 11 - 307 ng/mL    Comment: Performed at [facility], [address] [place], [place] [address]  Triglycerides     Status: None   Collection Time: 01/27/2019  7:34 PM  Result Value Ref Range   Triglycerides 119 <150 mg/dL     Comment: Performed at [facility], [address], [place], [place] [address]  C-reactive protein     Status: Abnormal   Collection Time: 01/08/2019  7:34 PM  Result Value Ref Range   CRP 10.9 (H) <1.0 mg/dL    Comment: Performed at [facility], [address] [place], [place] [address]  CBC WITH DIFFERENTIAL     Status: Abnormal   Collection Time: 01/27/2019  7:38 PM  Result Value Ref Range   WBC 9.0 4.0 - 10.5 K/uL   RBC 4.54 3.87 - 5.11 MIL/uL   Hemoglobin 14.1 12.0 - 15.0 g/dL   HCT 69.6 29.5 - 28.4 %   MCV 92.3 80.0 - 100.0 fL   MCH 31.1 26.0 - 34.0 pg   MCHC 33.7 30.0 - 36.0 g/dL   RDW 13.2 44.0 - 10.2 %   Platelets 180 150 - 400 K/uL   nRBC 0.0 0.0 - 0.2 %   Neutrophils Relative % 84 %   Neutro Abs 7.6 1.7 - 7.7 K/uL   Lymphocytes Relative 11 %   Lymphs Abs 1.0 0.7 - 4.0 K/uL   Monocytes Relative 4 %   Monocytes Absolute 0.3 0.1 - 1.0 K/uL   Eosinophils Relative 0 %   Eosinophils Absolute 0.0 0.0 - 0.5 K/uL   Basophils Relative 0 %   Basophils Absolute 0.0 0.0 - 0.1 K/uL   Immature Granulocytes 1 %   Abs Immature Granulocytes 0.08 (H) 0.00 - 0.07 K/uL    Comment: Performed at [facility], [address] [place], [place] [address]  Comprehensive metabolic panel     Status: Abnormal   Collection Time: 01/28/2019  7:38 PM  Result Value Ref Range   Sodium 133 (L) 135 - 145 mmol/L   Potassium 4.0 3.5 - 5.1 mmol/L   Chloride 102 98 - 111 mmol/L   CO2 17 (L) 22 - 32 mmol/L   Glucose, Bld 103 (H) 70 - 99 mg/dL   BUN 8 8 - 23 mg/dL   Creatinine, Ser 6.44 0.44 - 1.00 mg/dL   Calcium 8.4 (L) 8.9 - 10.3 mg/dL   Total Protein 6.6 6.5 - 8.1 g/dL   Albumin 3.0 (L) 3.5 - 5.0 g/dL   AST 83 (H) 15 - 41 U/L   ALT 63 (H) 0 - 44 U/L   Alkaline Phosphatase 79 38 - 126 U/L   Total Bilirubin 0.5 0.3 - 1.2 mg/dL   GFR calc non Af Amer >60 >60 mL/min   GFR calc Af Amer >60 >60 mL/min   Anion gap 14 5 - 15    Comment:  Performed at [facility] [address] [place], [place] [address]  D-dimer, quantitative     Status: Abnormal   Collection Time: 01/23/2019  7:38 PM  Result Value Ref Range   D-Dimer, Quant 1.15 (H) 0.00 - 0.50 ug/mL-FEU    Comment: (NOTE) At the manufacturer cut-off of 0.50 ug/mL FEU, this assay has been documented to exclude PE with a sensitivity and negative predictive value of 97 to 99%.  At this time, this assay has not been approved by the FDA to exclude DVT/VTE. Results should be correlated with clinical presentation. Performed at [facility], [address], [place], [place] [address]   Procalcitonin     Status: None   Collection Time: 01/13/2019  7:38 PM  Result Value Ref Range   Procalcitonin 1.21 ng/mL    Comment:        Interpretation: PCT > 0.5 ng/mL and <= 2 ng/mL: Systemic infection (sepsis) is possible, but other conditions are known to elevate PCT as well. (NOTE)       Sepsis PCT Algorithm           Lower Respiratory Tract                                      Infection PCT Algorithm    ----------------------------     ----------------------------         PCT < 0.25 ng/mL                PCT < 0.10 ng/mL         Strongly encourage             Strongly discourage   discontinuation of antibiotics    initiation of antibiotics    ----------------------------     -----------------------------       PCT 0.25 - 0.50 ng/mL            PCT 0.10 - 0.25 ng/mL               OR       >80% decrease in PCT            Discourage initiation of                                            antibiotics      Encourage discontinuation           of antibiotics    ----------------------------     -----------------------------         PCT >= 0.50 ng/mL              PCT 0.26 - 0.50 ng/mL                AND       <80% decrease in PCT             Encourage initiation of                                             antibiotics       Encourage  continuation           of antibiotics    ----------------------------     -----------------------------  PCT >= 0.50 ng/mL                  PCT > 0.50 ng/mL               AND         increase in PCT                  Strongly encourage                                      initiation of antibiotics    Strongly encourage escalation           of antibiotics                                     -----------------------------                                           PCT <= 0.25 ng/mL                                                 OR                                        > 80% decrease in PCT                                     Discontinue / Do not initiate                                             antibiotics Performed at [facility], [address] [place], [place] [address]   Lactate dehydrogenase     Status: Abnormal   Collection Time: 09-29-2018  7:38 PM  Result Value Ref Range   LDH 386 (H) 98 - 192 U/L    Comment: Performed at [facility], [address] [place], [place] [address]  Fibrinogen     Status: Abnormal   Collection Time: 09-29-2018  7:38 PM  Result Value Ref Range   Fibrinogen 528 (H) 210 - 475 mg/dL    Comment: Performed at [facility], [address] [place], [place] [address]  Lactic acid, plasma     Status: None   Collection Time: 09-29-2018  7:40 PM  Result Value Ref Range   Lactic Acid, Venous 1.6 0.5 - 1.9 mmol/L    Comment: Performed at [facility], [address] [place], [place] [address]  Lactic acid, plasma     Status: Abnormal   Collection Time: 09-29-2018  9:34 PM  Result Value Ref Range   Lactic Acid, Venous 2.0 (HH) 0.5 - 1.9 mmol/L    Comment: CRITICAL RESULT CALLED TO, READ BACK BY AND VERIFIED WITH: KATIE WICKER @  2225 ON February 09, 2019 C VARNER Performed at [facility], [address], [place], [place] [address]   Troponin I (High Sensitivity)      Status: None   Collection Time: 02/09/19  9:34 PM  Result Value Ref Range   Troponin I (High Sensitivity) 9 <18 ng/L    Comment: (NOTE) Elevated high sensitivity troponin I (hsTnI) values and significant  changes across serial measurements may suggest ACS but many other  chronic and acute conditions are known to elevate hsTnI results.  Refer to the "Links" section for chest pain algorithms and additional  guidance. Performed at [facility], [address], [place], [place] [address]   Lactic acid, plasma     Status: None   Collection Time: February 09, 2019 11:44 PM  Result Value Ref Range   Lactic Acid, Venous 1.5 0.5 - 1.9 mmol/L    Comment: Performed at [facility], [address], [place], [place] [address]  ABO/Rh     Status: None   Collection Time: Feb 09, 2019 11:44 PM  Result Value Ref Range   ABO/RH(D)      [place] NEG Performed at [facility], [address], [place], [place] [address]   Blood gas, arterial     Status: Abnormal   Collection Time: February 09, 2019 11:44 PM  Result Value Ref Range   pH, Arterial 7.364 7.350 - 7.450   pCO2 arterial 27.9 (L) 32.0 - 48.0 mmHg   pO2, Arterial 53.4 (L) 83.0 - 108.0 mmHg   Bicarbonate 15.6 (L) 20.0 - 28.0 mmol/L   Acid-base deficit 8.3 (H) 0.0 - 2.0 mmol/L   O2 Saturation 85.7 %   Patient temperature 97.7    Allens test (pass/fail) PASS PASS    Comment: Performed at [facility], [address], [place], [place] [address]  SARS CORONAVIRUS 2 (TAT 6-24 HRS) Nasopharyngeal Nasopharyngeal Swab     Status: Abnormal   Collection Time: 02/09/19 11:44 PM   Specimen: Nasopharyngeal Swab  Result Value Ref Range   SARS Coronavirus 2 POSITIVE (A) NEGATIVE    Comment: RESULT CALLED TO, READ BACK BY AND VERIFIED WITH: Cyndie Mull RN 9:40 01/20/19 ([email or web address]) (NOTE) SARS-CoV-2 target nucleic acids are DETECTED. The SARS-CoV-2 RNA is generally detectable in upper and  lower respiratory specimens during the acute phase of infection. Positive results are indicative of active infection with SARS-CoV-2. Clinical  correlation with patient history and other diagnostic information is necessary to determine patient infection status. Positive results do  not rule out bacterial infection or co-infection with other viruses. The expected result is Negative. Fact Sheet for Patients: HairSlick.no Fact Sheet for Healthcare Providers: quierodirigir.com This test is not yet approved or cleared by the [place] FDA and  has been authorized for detection and/or diagnosis of SARS-CoV-2 by FDA under an Emergency Use Authorization (EUA). This EUA will remain  in effect (meaning this test can be used) for  the duration of the COVID-19 declaration under Section 564(b)(1) of the Act, 21 U.S.C. section 360bbb-3(b)(1), unless the authorization is terminated or revoked sooner. Performed at [facility], [address], [place], [place] [address]   Blood gas, arterial     Status: Abnormal   Collection Time: 01/20/19  4:12 AM  Result Value Ref Range   FIO2 100.00    pH, Arterial 7.368 7.350 - 7.450   pCO2 arterial 28.0 (L) 32.0 - 48.0 mmHg   pO2, Arterial 60.3 (L) 83.0 - 108.0 mmHg  Bicarbonate 15.8 (L) 20.0 - 28.0 mmol/L   Acid-base deficit 8.0 (H) 0.0 - 2.0 mmol/L   O2 Saturation 90.3 %   Patient temperature 97.7    Allens test (pass/fail) PASS PASS    Comment: Performed at [facility], [address] [place], [place] [address]  CBC with Differential/Platelet     Status: Abnormal   Collection Time: 01/20/19  5:11 AM  Result Value Ref Range   WBC 10.3 4.0 - 10.5 K/uL   RBC 4.15 3.87 - 5.11 MIL/uL   Hemoglobin 12.7 12.0 - 15.0 g/dL   HCT 60.4 54.0 - 98.1 %   MCV 92.3 80.0 - 100.0 fL   MCH 30.6 26.0 - 34.0 pg   MCHC 33.2 30.0 - 36.0 g/dL   RDW 19.1 47.8 - 29.5 %   Platelets 175 150  - 400 K/uL   nRBC 0.0 0.0 - 0.2 %   Neutrophils Relative % 90 %   Neutro Abs 9.3 (H) 1.7 - 7.7 K/uL   Lymphocytes Relative 6 %   Lymphs Abs 0.7 0.7 - 4.0 K/uL   Monocytes Relative 2 %   Monocytes Absolute 0.2 0.1 - 1.0 K/uL   Eosinophils Relative 0 %   Eosinophils Absolute 0.0 0.0 - 0.5 K/uL   Basophils Relative 0 %   Basophils Absolute 0.0 0.0 - 0.1 K/uL   Immature Granulocytes 2 %   Abs Immature Granulocytes 0.16 (H) 0.00 - 0.07 K/uL    Comment: Performed at [facility], [address], [place], [place] [address]  Comprehensive metabolic panel     Status: Abnormal   Collection Time: 01/20/19  5:11 AM  Result Value Ref Range   Sodium 139 135 - 145 mmol/L   Potassium 4.7 3.5 - 5.1 mmol/L   Chloride 110 98 - 111 mmol/L   CO2 16 (L) 22 - 32 mmol/L   Glucose, Bld 168 (H) 70 - 99 mg/dL   BUN 7 (L) 8 - 23 mg/dL   Creatinine, Ser 8.65 0.44 - 1.00 mg/dL   Calcium 7.8 (L) 8.9 - 10.3 mg/dL   Total Protein 5.8 (L) 6.5 - 8.1 g/dL   Albumin 2.5 (L) 3.5 - 5.0 g/dL   AST 66 (H) 15 - 41 U/L   ALT 51 (H) 0 - 44 U/L   Alkaline Phosphatase 71 38 - 126 U/L   Total Bilirubin 0.4 0.3 - 1.2 mg/dL   GFR calc non Af Amer >60 >60 mL/min   GFR calc Af Amer >60 >60 mL/min   Anion gap 13 5 - 15    Comment: Performed at [facility], [address], [place], [place] [address]  C-reactive protein     Status: Abnormal   Collection Time: 01/20/19  5:11 AM  Result Value Ref Range   CRP 13.0 (H) <1.0 mg/dL    Comment: Performed at [facility], [address] [place], [place] [address]  D-dimer, quantitative (not at [facility])     Status: Abnormal   Collection Time: 01/20/19  5:11 AM  Result Value Ref Range   D-Dimer, Quant 1.25 (H) 0.00 - 0.50 ug/mL-FEU    Comment: (NOTE) At the manufacturer cut-off of 0.50 ug/mL FEU, this assay has been documented to exclude PE with a sensitivity and negative predictive value of 97 to 99%.  At this time, this  assay has not been approved by the FDA to exclude DVT/VTE. Results should be correlated with clinical presentation. Performed at [facility], [address] [place], [place] [address]   Lactate dehydrogenase     Status: Abnormal   Collection Time: 01/20/19  5:11 AM  Result Value Ref Range   LDH 345 (H) 98 - 192 U/L    Comment: Performed at [facility], [address], [place], [place] [address]  Ferritin     Status: Abnormal   Collection Time: 01/20/19  5:11 AM  Result Value Ref Range   Ferritin 843 (H) 11 - 307 ng/mL    Comment: Performed at [facility], [address], [place], [place] [address]  Procalcitonin - Baseline     Status: None   Collection Time: 01/20/19  9:29 AM  Result Value Ref Range   Procalcitonin 0.62 ng/mL    Comment:        Interpretation: PCT > 0.5 ng/mL and <= 2 ng/mL: Systemic infection (sepsis) is possible, but other conditions are known to elevate PCT as well. (NOTE)       Sepsis PCT Algorithm           Lower Respiratory Tract                                      Infection PCT Algorithm    ----------------------------     ----------------------------         PCT < 0.25 ng/mL                PCT < 0.10 ng/mL         Strongly encourage             Strongly discourage   discontinuation of antibiotics    initiation of antibiotics    ----------------------------     -----------------------------       PCT 0.25 - 0.50 ng/mL            PCT 0.10 - 0.25 ng/mL               OR       >80% decrease in PCT            Discourage initiation of                                            antibiotics      Encourage discontinuation           of antibiotics    ----------------------------     -----------------------------         PCT >= 0.50 ng/mL              PCT 0.26 - 0.50 ng/mL                AND       <80% decrease in PCT             Encourage initiation of                                              antibiotics       Encourage continuation           of antibiotics    ----------------------------     -----------------------------  PCT >= 0.50 ng/mL                  PCT > 0.50 ng/mL               AND         increase in PCT                  Strongly encourage                                      initiation of antibiotics    Strongly encourage escalation           of antibiotics                                     -----------------------------                                           PCT <= 0.25 ng/mL                                                 OR                                        > 80% decrease in PCT                                     Discontinue / Do not initiate                                             antibiotics Performed at [facility], [address], [place], [place] [address]   Fibrinogen     Status: Abnormal   Collection Time: 01/20/19  9:29 AM  Result Value Ref Range   Fibrinogen 545 (H) 210 - 475 mg/dL    Comment: Performed at [facility], [address], [place], [place] [address]  Sedimentation rate     Status: None   Collection Time: 01/20/19  9:29 AM  Result Value Ref Range   Sed Rate 18 0 - 22 mm/hr    Comment: Performed at [facility], [address], [place], [place] [address]   Dg Chest Port 1 View  Result Date: 01/22/2019 CLINICAL DATA:  Shortness breath, positive COVID-19 on Sunday EXAM: PORTABLE CHEST 1 VIEW COMPARISON:  None. FINDINGS: Combined interstitial and airspace opacities most pronounced throughout the right lung and in the left lung base. No pneumothorax. No effusion. The cardiomediastinal contours are unremarkable. No acute osseous or soft tissue abnormality. IMPRESSION: Appearance most concerning for a multifocal pneumonia in the setting of known COVID-19 positivity. Electronically Signed   By: Kreg Shropshire M.D.   On: 01/01/2019 20:11    Pending Labs Unresulted  Labs (From admission,  onward)    Start     Ordered   01/21/19 0500  Procalcitonin  Daily,   R     01/20/19 0900   01/21/19 0500  Fibrinogen  Daily,   R     01/20/19 1659   01/21/19 0500  Ferritin  Daily,   R     01/20/19 1659   01/21/19 0500  Magnesium  Tomorrow morning,   R     01/20/19 1659   01/21/19 0500  Lactate dehydrogenase  Daily,   R     01/20/19 1659   01/21/19 0500  Phosphorus  Tomorrow morning,   R     01/20/19 1659   01/21/19 0500  Sedimentation rate  Daily,   R     01/20/19 1659   01/21/19 0500  Comprehensive metabolic panel  Tomorrow morning,   R     01/20/19 1659   01/21/19 0500  CBC with Differential/Platelet  Tomorrow morning,   R     01/20/19 1659   01/21/19 0500  D-dimer, quantitative (not at [facility])  Daily,   R     01/20/19 1659   01/21/19 0500  C-reactive protein  Daily,   R     01/20/19 1659   01/20/19 0500  CBC with Differential/Platelet  Daily,   R     01/11/2019 2324   01/20/19 0500  Comprehensive metabolic panel  Daily,   R     01/29/2019 2324   01/20/19 0500  C-reactive protein  Daily,   R     01/03/2019 2324   01/20/19 0500  D-dimer, quantitative (not at [facility])  Daily,   R     01/29/2019 2324   01/20/19 0500  Lactate dehydrogenase  Daily,   R     01/01/2019 2324          Vitals/Pain Today's Vitals   01/20/19 1900 01/20/19 1930 01/20/19 2000 01/20/19 2030  BP: (!) 113/98 (!) 149/70 (!) 143/71 (!) 161/66  Pulse: 85 83 85 85  Resp: (!) 32 (!) 29 (!) 29 (!) 30  Temp:      TempSrc:      SpO2: 94% 95% 94% 92%  Weight:      Height:      PainSc:        Isolation Precautions Airborne and Contact precautions  Medications Medications  cefTRIAXone (ROCEPHIN) 2 g in sodium chloride 0.9 % 100 mL IVPB (0 g Intravenous Stopped 01/02/2019 2139)  azithromycin (ZITHROMAX) 500 mg in sodium chloride 0.9 % 250 mL IVPB (0 mg Intravenous Stopped 01/29/2019 2156)  atorvastatin (LIPITOR) tablet 40 mg (40 mg Oral Given 01/20/19 0919)  fluticasone furoate-vilanterol (BREO  ELLIPTA) 200-25 MCG/INH 1 puff (1 puff Inhalation Given 01/20/19 0922)  montelukast (SINGULAIR) tablet 10 mg (10 mg Oral Given 01/20/19 1103)  albuterol (VENTOLIN HFA) 108 (90 Base) MCG/ACT inhaler 2-4 puff (4 puffs Inhalation Given 01/20/19 0520)  enoxaparin (LOVENOX) injection 40 mg (40 mg Subcutaneous Given 01/20/19 0048)  dexamethasone (DECADRON) injection 6 mg (6 mg Intravenous Given 01/20/19 0924)  guaiFENesin-dextromethorphan (ROBITUSSIN DM) 100-10 MG/5ML syrup 10 mL (has no administration in time range)  chlorpheniramine-HYDROcodone (TUSSIONEX) 10-8 MG/5ML suspension 5 mL (has no administration in time range)  vitamin C (ASCORBIC ACID) tablet 500 mg (500 mg Oral Given 01/20/19 0918)  zinc sulfate capsule 220 mg (220 mg Oral Given 01/20/19 0918)  acetaminophen (TYLENOL) tablet 650 mg (has no administration in time range)  Ipratropium-Albuterol (COMBIVENT) respimat 1 puff (1 puff Inhalation Given 01/20/19  1633)  remdesivir 200 mg in sodium chloride 0.9 % 250 mL IVPB (0 mg Intravenous Stopped 01/20/19 0141)    Followed by  remdesivir 100 mg in sodium chloride 0.9 % 250 mL IVPB (0 mg Intravenous Stopped 01/20/19 1645)  magic mouthwash w/lidocaine (5 mLs Oral Given 01/20/19 1829)  nystatin (MYCOSTATIN) 100000 UNIT/ML suspension 500,000 Units (500,000 Units Oral Given 01/20/19 1829)  dexamethasone (DECADRON) injection 10 mg (10 mg Intravenous Given 2019-01-20 2012)  albuterol (VENTOLIN HFA) 108 (90 Base) MCG/ACT inhaler 4 puff (4 puffs Inhalation Given 20-Jan-2019 2011)  sodium chloride 0.9 % bolus 500 mL (0 mLs Intravenous Stopped 01/20/19 0039)  fluconazole (DIFLUCAN) tablet 150 mg (150 mg Oral Given 01/20/19 1828)    Mobility walks with device Low fall risk   Focused Assessments Pulmonary Assessment Handoff:  Lung sounds:   O2 Device: NRB O2 Flow Rate (L/min): 6 L/min      R Recommendations: See Admitting Provider Note  Report given to: GV 1E room 118

## 2019-01-20 NOTE — ED Notes (Signed)
Called daughter from room per daughter request and also gave daughter an update on transfer to [place].  Daughter would like to be called once pt has arrived at [facility].

## 2019-01-20 NOTE — Progress Notes (Signed)
PROGRESS NOTE    Ashley Cook  [id number]:[id number] DOB: 04-10-1945 DOA: 01/16/2019 PCP: System, Pcp Not In   Brief Narrative:  HPI per Dr. Shela Leff on 01/24/2019 Ashley Cook is a 73 y.o. female with medical history significant of asthma, COPD presenting to the hospital with complaints of shortness of breath and cough.  Patient tested positive for COVID-19 on 11/15.  Complaining of shortness of breath, cough, fatigue, and body aches.  She is not sure when the symptoms started.  Denies chest pain, nausea, vomiting, abdominal pain, or diarrhea.  No additional history could be obtained from her.  ED Course: Oxygen saturation 74% on room air on arrival, improved with 15 L oxygen via nonrebreather.  Tachypneic with respiratory rate up to 30s.  Afebrile and no leukocytosis.  Lactic acid normal.  Bicarb 17.  AST 83, ALT 63.  Alk phos and T bili normal.  Blood culture x2 pending.  Inflammatory markers elevated: Ferritin 777, CRP 10.9, D-dimer 1.15, LDH 386, fibrinogen 528.  Procalcitonin 1.21.  Chest x-ray showing multifocal airspace opacities concerning for COVID-19 pneumonia.  EKG showing diffuse T wave inversions, no prior tracing for comparison. Patient received albuterol MDI treatment, Decadron 10 mg, ceftriaxone, azithromycin, and IV fluid.  **Interim History  Patient was on a nonrebreather when I saw her and was complaining about mouth pain.  No nausea or vomiting but did feel short of breath and appeared fatigued.  Transferring to [facility]:   Principal Problem:   Pneumonia due to COVID-19 virus Active Problems:   Acute respiratory failure with hypoxia (HCC)   HLD (hyperlipidemia)   COPD (chronic obstructive pulmonary disease) (HCC)  Acute Hypoxemic Respiratory Failure secondary to COVID-19 multifocal Viral pneumonia with ?Bacterial PNA in the setting of COPD and Asthma -Tested positive for COVID-19 on 11/15 and again here.   -Oxygen  saturation 74% on room air.  Placed on 6 L supplemental oxygen via nasal cannula in the ED.   -ABG reflected moderate hypoxemia.   -Placed on 15 L supplemental oxygen via nonrebreather and now on 5 Liters via NRB. -Immediately Desaturates when NRB is removed   -Continues to be Tachypneic with respiratory rate in the 20s to 30s.  -Afebrile and no leukocytosis.  Lactic acid borderline elevated, suspect related to dehydration as it improved with a small fluid bolus.   -Chest x-ray showing multifocal airspace opacities concerning for COVID-19 pneumonia.   -Initial Inflammatory markers elevated: Ferritin 777, CRP 10.9, D-dimer 1.15, LDH 386, fibrinogen 528.  Transaminases mildly elevated.?Possible bacterial superinfection -Repeat inflammatory markers showed an LDH of 345, ferritin level 843, CRP of 13.0, D-dimer of 1.25, and fibrinogen 545 -Procalcitonin 1.21 and now trending down . -Remdesivir -IV Decadron 6 mg daily -Continue ceftriaxone and azithromycin -Antitussives as needed -Tylenol as needed -Vitamin C and zinc -Continuous pulse ox -Supplemental oxygen to keep oxygen saturation above 90% and requiring NRB -Started Combivent in addition to [employer] and and PRN Albuterol -May need Actemra if not improving  -Blood culture x2 pending -Daily CBC with differential, CMP, LDH, CRP, D-dimer -Goal is to maintain euvolemia to net negative fluid balance -Lovenox for VT prophylaxis  Abnormal EKG EKG showing diffuse T wave inversions, no prior tracing for comparison.  High-sensitivity troponin negative at 9.   Patient denies chest pain.  Asthma, COPD -Not wheezing. -Treatment as Above  Hyperlipidemia -Continue Statin  may need to hold given her abnormal LFTs  Abnormal LFTs -Patient's AST is 66, ALT 51 -  Trended down from 83 and 63 respectively -Continue monitor and trend and if necessary will obtain a right upper quadrant ultrasound as well as an acute hepatitis panel next-continue  monitor and trend hepatic function panel and repeat CMP in a.m.  Hyponatremia -Improved -Patient's is sodium is now 139 -Given a 500 mL bolus this morning -Continue to monitor and trend and repeat CMP in a.m.  Metabolic Acidosis  -Patient's CO2 16, anion gap of 13, chloride is 110 -May need some sodium bicarbonate -Continue to monitor and trend and repeat CMP in a.m.  Thrush/Mouth Ulcers -Started Nystatin, Magic Mouthwash with Lidocaine, and Gave 1x of Fluconazole 150 mg -May get worse as she is being started on Steroids   DVT prophylaxis: Enoxaparin 40 mg sq q24h Code Status: FULL CODE  Family Communication: No family present at bedside but updated daughter over telephone and had a lengthy conversation with her Disposition Plan: Transfer to [facility]  Consultants:   None  Procedures:  None   Antimicrobials:  Anti-infectives (From admission, onward)   Start     Dose/Rate Route Frequency Ordered Stop   01/21/19 1000  remdesivir 100 mg in sodium chloride 0.9 % 250 mL IVPB  Status:  Discontinued     100 mg 500 mL/hr over 30 Minutes Intravenous Every 24 hours 01/20/19 0008 01/20/19 1015   01/20/19 1600  remdesivir 100 mg in sodium chloride 0.9 % 250 mL IVPB     100 mg 500 mL/hr over 30 Minutes Intravenous Daily 01/20/19 1015 01/24/19 0959   01/20/19 0015  remdesivir 200 mg in sodium chloride 0.9 % 250 mL IVPB     200 mg 500 mL/hr over 30 Minutes Intravenous Once 01/20/19 0008 01/20/19 0141   01/04/2019 2045  cefTRIAXone (ROCEPHIN) 2 g in sodium chloride 0.9 % 100 mL IVPB     2 g 200 mL/hr over 30 Minutes Intravenous Every 24 hours 01/18/2019 2032     01/28/2019 2045  azithromycin (ZITHROMAX) 500 mg in sodium chloride 0.9 % 250 mL IVPB     500 mg 250 mL/hr over 60 Minutes Intravenous Every 24 hours 01/07/2019 2032       Subjective: Seen and examined at bedside in the ED and she is wearing a nonrebreather and was tachypneic and complaining of a little bit of shortness of breath.   States that she feels a little bit better with oxygen.  No nausea or vomiting but still feels very fatigued and tired.  Complains of mouth pain and at the mouth ulcers and some thrush.  No other concerns or complaints at this time.  Objective: Vitals:   01/20/19 1300 01/20/19 1330 01/20/19 1400 01/20/19 1430  BP: (!) 141/61 (!) 152/66 (!) 174/61 (!) 147/67  Pulse: 83 80 81 81  Resp: (!) 26 (!) 26 (!) 34 (!) 28  Temp:      TempSrc:      SpO2: 93% 96% 92% 95%  Weight:      Height:        Intake/Output Summary (Last 24 hours) at 01/20/2019 1506 Last data filed at 01/20/2019 2505 Gross per 24 hour  Intake 1600 ml  Output 650 ml  Net 950 ml   Filed Weights   01/03/2019 1910  Weight: 47.6 kg   Examination: Physical Exam:  Constitutional: Elderly Caucasian female in some Respiratory Distress appears a little fatigued Eyes: Lids and conjunctivae normal, sclerae anicteric  ENMT: External Ears, Nose appear normal. Grossly normal hearing. Had some thrush noted Neck: Appears normal,  supple, no cervical masses, normal ROM, no appreciable thyromegaly; no JVD Respiratory: Diminished to auscultation bilaterally with coarse breath sounds, no wheezing, rales, rhonchi or crackles. Tachypenic and wearing a NRB.  Cardiovascular: RRR, no murmurs / rubs / gallops. S1 and S2 auscultated. No extremity edema.   Abdomen: Soft, non-tender, non-distended. Bowel sounds positive.  GU: Deferred. Musculoskeletal: No clubbing / cyanosis of digits/nails. No joint deformity upper and lower extremities.  Skin: No rashes, lesions, ulcers on a limited skin evaluation. No induration; Warm and dry.  Neurologic: CN 2-12 grossly intact with no focal deficits. Romberg sign cerebellar reflexes not assessed.  Psychiatric: Normal judgment and insight. Alert awake but very fatigued appearing. Anxious mood and appropriate affect.   Data Reviewed: I have personally reviewed following labs and imaging studies  CBC: Recent  Labs  Lab 01/22/2019 1938 01/20/19 0511  WBC 9.0 10.3  NEUTROABS 7.6 9.3*  HGB 14.1 12.7  HCT 41.9 38.3  MCV 92.3 92.3  PLT 180 354   Basic Metabolic Panel: Recent Labs  Lab 01/10/2019 1938 01/20/19 0511  NA 133* 139  K 4.0 4.7  CL 102 110  CO2 17* 16*  GLUCOSE 103* 168*  BUN 8 7*  CREATININE 0.92 0.83  CALCIUM 8.4* 7.8*   GFR: Estimated Creatinine Clearance: 44.7 mL/min (by C-G formula based on SCr of 0.83 mg/dL). Liver Function Tests: Recent Labs  Lab 01/26/2019 1938 01/20/19 0511  AST 83* 66*  ALT 63* 51*  ALKPHOS 79 71  BILITOT 0.5 0.4  PROT 6.6 5.8*  ALBUMIN 3.0* 2.5*   No results for input(s): LIPASE, AMYLASE in the last 168 hours. No results for input(s): AMMONIA in the last 168 hours. Coagulation Profile: No results for input(s): INR, PROTIME in the last 168 hours. Cardiac Enzymes: No results for input(s): CKTOTAL, CKMB, CKMBINDEX, TROPONINI in the last 168 hours. BNP (last 3 results) No results for input(s): PROBNP in the last 8760 hours. HbA1C: No results for input(s): HGBA1C in the last 72 hours. CBG: No results for input(s): GLUCAP in the last 168 hours. Lipid Profile: Recent Labs    01/22/2019 1934  TRIG 119   Thyroid Function Tests: No results for input(s): TSH, T4TOTAL, FREET4, T3FREE, THYROIDAB in the last 72 hours. Anemia Panel: Recent Labs    01/25/2019 1934 01/20/19 0511  FERRITIN 777* 843*   Sepsis Labs: Recent Labs  Lab 01/13/2019 1938 01/20/2019 1940 01/15/2019 2134 01/06/2019 2344 01/20/19 0929  PROCALCITON 1.21  --   --   --  0.62  LATICACIDVEN  --  1.6 2.0* 1.5  --     Recent Results (from the past 240 hour(s))  Novel Coronavirus, NAA ([facility])     Status: None   Collection Time: 01/11/19 12:00 AM   Specimen: Nasopharyngeal(NP) swabs in vial transport medium   NASOPHARYNGE  TESTING  Result Value Ref Range Status   SARS-CoV-2, NAA Not Detected Not Detected Final    Comment: This nucleic acid amplification test was developed  and its performance characteristics determined by [employer]. Nucleic acid amplification tests include PCR and TMA. This test has not been FDA cleared or approved. This test has been authorized by FDA under an Emergency Use Authorization (EUA). This test is only authorized for the duration of time the declaration that circumstances exist justifying the authorization of the emergency use of in vitro diagnostic tests for detection of SARS-CoV-2 virus and/or diagnosis of COVID-19 infection under section 564(b)(1) of the Act, 21 U.S.C. 656CLE-7(N) (1), unless the authorization is  terminated or revoked sooner. When diagnostic testing is negative, the possibility of a false negative result should be considered in the context of a patient's recent exposures and the presence of clinical signs and symptoms consistent with COVID-19. An individual without symptoms of COVID-19 and who is not shedding SARS-CoV-2 virus would  expect to have a negative (not detected) result in this assay.   Blood Culture (routine x 2)     Status: None (Preliminary result)   Collection Time: 01/27/2019  7:34 PM   Specimen: BLOOD  Result Value Ref Range Status   Specimen Description   Final    BLOOD LEFT ANTECUBITAL Performed at [facility] [address] [place], [place] [address]    Special Requests   Final    BOTTLES DRAWN AEROBIC AND ANAEROBIC Blood Culture adequate volume Performed at [facility] [address], [place], [place] [address]    Culture   Final    NO GROWTH < 12 HOURS Performed at [facility] [address] [place], [place] [address]    Report Status PENDING  Incomplete  Blood Culture (routine x 2)     Status: None (Preliminary result)   Collection Time: 01/05/2019  7:34 PM   Specimen: BLOOD LEFT FOREARM  Result Value Ref Range Status   Specimen Description   Final    BLOOD LEFT FOREARM Performed at [facility], [place]  [address] [place], [place] [address]    Special Requests   Final    BOTTLES DRAWN AEROBIC AND ANAEROBIC Blood Culture results may not be optimal due to an inadequate volume of blood received in culture bottles Performed at [facility] [address], [place], [place] [address]    Culture   Final    NO GROWTH < 12 HOURS Performed at [facility] [address], [place], [place] [address]    Report Status PENDING  Incomplete  SARS CORONAVIRUS 2 (TAT 6-24 HRS) Nasopharyngeal Nasopharyngeal Swab     Status: Abnormal   Collection Time: 01/25/2019 11:44 PM   Specimen: Nasopharyngeal Swab  Result Value Ref Range Status   SARS Coronavirus 2 POSITIVE (A) NEGATIVE Final    Comment: RESULT CALLED TO, READ BACK BY AND VERIFIED WITH: Marisa Hua RN 9:40 01/20/19 ([email or web address]) (NOTE) SARS-CoV-2 target nucleic acids are DETECTED. The SARS-CoV-2 RNA is generally detectable in upper and lower respiratory specimens during the acute phase of infection. Positive results are indicative of active infection with SARS-CoV-2. Clinical  correlation with patient history and other diagnostic information is necessary to determine patient infection status. Positive results do  not rule out bacterial infection or co-infection with other viruses. The expected result is Negative. Fact Sheet for Patients: SugarRoll.be Fact Sheet for Healthcare Providers: https://www.woods-mathews.com/ This test is not yet approved or cleared by the [place] FDA and  has been authorized for detection and/or diagnosis of SARS-CoV-2 by FDA under an Emergency Use Authorization (EUA). This EUA will remain  in effect (meaning this test can be used) for  the duration of the COVID-19 declaration under Section 564(b)(1) of the Act, 21 U.S.C. section 360bbb-3(b)(1), unless the authorization is terminated or revoked sooner. Performed at [facility], [place] [address]  [place], [place] [address]     Radiology Studies: Dg Chest Port 1 View  Result Date: 01/06/2019 CLINICAL DATA:  Shortness breath, positive COVID-19 on Sunday EXAM: PORTABLE CHEST 1 VIEW COMPARISON:  None. FINDINGS: Combined interstitial and  airspace opacities most pronounced throughout the right lung and in the left lung base. No pneumothorax. No effusion. The cardiomediastinal contours are unremarkable. No acute osseous or soft tissue abnormality. IMPRESSION: Appearance most concerning for a multifocal pneumonia in the setting of known COVID-19 positivity. Electronically Signed   By: Lovena Le M.D.   On: 01/20/2019 20:11   Scheduled Meds: . atorvastatin  40 mg Oral Daily  . dexamethasone (DECADRON) injection  6 mg Intravenous Q24H  . enoxaparin (LOVENOX) injection  40 mg Subcutaneous QHS  . fluticasone furoate-vilanterol  1 puff Inhalation Daily  . Ipratropium-Albuterol  1 puff Inhalation Q6H  . montelukast  10 mg Oral Daily  . vitamin C  500 mg Oral Daily  . zinc sulfate  220 mg Oral Daily   Continuous Infusions: . azithromycin Stopped (01/21/2019 2156)  . cefTRIAXone (ROCEPHIN)  IV Stopped (01/04/2019 2139)  . remdesivir 100 mg in NS 250 mL      LOS: 1 day   Kerney Elbe, DO [facility] PAGER is on [place]  If 7PM-7AM, please contact night-coverage [email or web address]

## 2019-01-20 NOTE — ED Notes (Addendum)
[facility] here for transport.   Called and updated daughter Ashley Cook that her mother is being transported, also provided phone number to the unit at [facility].

## 2019-01-20 NOTE — ED Notes (Signed)
Daughter, Charline Bills, wants an update, [phone]. Son Toya Palacios if sister can't be reached, [phone].

## 2019-01-20 NOTE — ED Notes (Signed)
[facility] called. 

## 2019-01-20 NOTE — ED Notes (Signed)
Attempted to call report, but the receiving nurse will return call for report.

## 2019-01-21 ENCOUNTER — Inpatient Hospital Stay (HOSPITAL_COMMUNITY): Payer: Medicare Other

## 2019-01-21 DIAGNOSIS — J449 Chronic obstructive pulmonary disease, unspecified: Secondary | ICD-10-CM | POA: Diagnosis not present

## 2019-01-21 DIAGNOSIS — E785 Hyperlipidemia, unspecified: Secondary | ICD-10-CM | POA: Diagnosis not present

## 2019-01-21 DIAGNOSIS — J9601 Acute respiratory failure with hypoxia: Secondary | ICD-10-CM | POA: Diagnosis not present

## 2019-01-21 DIAGNOSIS — U071 COVID-19: Secondary | ICD-10-CM | POA: Diagnosis not present

## 2019-01-21 LAB — CBC WITH DIFFERENTIAL/PLATELET
Abs Immature Granulocytes: 0.14 10*3/uL — ABNORMAL HIGH (ref 0.00–0.07)
Basophils Absolute: 0 10*3/uL (ref 0.0–0.1)
Basophils Relative: 0 %
Eosinophils Absolute: 0 10*3/uL (ref 0.0–0.5)
Eosinophils Relative: 0 %
HCT: 40.3 % (ref 36.0–46.0)
Hemoglobin: 13.4 g/dL (ref 12.0–15.0)
Immature Granulocytes: 1 %
Lymphocytes Relative: 9 %
Lymphs Abs: 1.3 10*3/uL (ref 0.7–4.0)
MCH: 30.2 pg (ref 26.0–34.0)
MCHC: 33.3 g/dL (ref 30.0–36.0)
MCV: 91 fL (ref 80.0–100.0)
Monocytes Absolute: 0.7 10*3/uL (ref 0.1–1.0)
Monocytes Relative: 5 %
Neutro Abs: 12.9 10*3/uL — ABNORMAL HIGH (ref 1.7–7.7)
Neutrophils Relative %: 85 %
Platelets: 262 10*3/uL (ref 150–400)
RBC: 4.43 MIL/uL (ref 3.87–5.11)
RDW: 15.1 % (ref 11.5–15.5)
WBC: 15.1 10*3/uL — ABNORMAL HIGH (ref 4.0–10.5)
nRBC: 0 % (ref 0.0–0.2)

## 2019-01-21 LAB — POCT I-STAT 7, (LYTES, BLD GAS, ICA,H+H)
Acid-base deficit: 8 mmol/L — ABNORMAL HIGH (ref 0.0–2.0)
Bicarbonate: 16.7 mmol/L — ABNORMAL LOW (ref 20.0–28.0)
Calcium, Ion: 1.24 mmol/L (ref 1.15–1.40)
HCT: 34 % — ABNORMAL LOW (ref 36.0–46.0)
Hemoglobin: 11.6 g/dL — ABNORMAL LOW (ref 12.0–15.0)
O2 Saturation: 83 %
Patient temperature: 98.2
Potassium: 4.1 mmol/L (ref 3.5–5.1)
Sodium: 144 mmol/L (ref 135–145)
TCO2: 18 mmol/L — ABNORMAL LOW (ref 22–32)
pCO2 arterial: 29.1 mmHg — ABNORMAL LOW (ref 32.0–48.0)
pH, Arterial: 7.365 (ref 7.350–7.450)
pO2, Arterial: 48 mmHg — ABNORMAL LOW (ref 83.0–108.0)

## 2019-01-21 LAB — COMPREHENSIVE METABOLIC PANEL
ALT: 48 U/L — ABNORMAL HIGH (ref 0–44)
AST: 66 U/L — ABNORMAL HIGH (ref 15–41)
Albumin: 2.7 g/dL — ABNORMAL LOW (ref 3.5–5.0)
Alkaline Phosphatase: 84 U/L (ref 38–126)
Anion gap: 13 (ref 5–15)
BUN: 25 mg/dL — ABNORMAL HIGH (ref 8–23)
CO2: 16 mmol/L — ABNORMAL LOW (ref 22–32)
Calcium: 8.2 mg/dL — ABNORMAL LOW (ref 8.9–10.3)
Chloride: 113 mmol/L — ABNORMAL HIGH (ref 98–111)
Creatinine, Ser: 0.76 mg/dL (ref 0.44–1.00)
GFR calc Af Amer: >60 mL/min (ref >60)
GFR calc non Af Amer: >60 mL/min (ref >60)
Glucose, Bld: 120 mg/dL — ABNORMAL HIGH (ref 70–99)
Potassium: 4.5 mmol/L (ref 3.5–5.1)
Sodium: 142 mmol/L (ref 135–145)
Total Bilirubin: 0.6 mg/dL (ref 0.3–1.2)
Total Protein: 6.2 g/dL — ABNORMAL LOW (ref 6.5–8.1)

## 2019-01-21 LAB — FERRITIN: Ferritin: 740 ng/mL — ABNORMAL HIGH (ref 11–307)

## 2019-01-21 LAB — C-REACTIVE PROTEIN: CRP: 10.1 mg/dL — ABNORMAL HIGH (ref <1.0)

## 2019-01-21 LAB — ABO/RH: ABO/RH(D): O NEG

## 2019-01-21 LAB — D-DIMER, QUANTITATIVE: D-Dimer, Quant: 1.6 ug/mL-FEU — ABNORMAL HIGH (ref 0.00–0.50)

## 2019-01-21 MED ORDER — ENOXAPARIN SODIUM 40 MG/0.4ML ~~LOC~~ SOLN
40.0000 mg | Freq: Every day | SUBCUTANEOUS | Status: DC
  Administered 2019-01-21: 40 mg via SUBCUTANEOUS
  Filled 2019-01-21: qty 0.4

## 2019-01-21 MED ORDER — AZITHROMYCIN 250 MG PO TABS
500.0000 mg | ORAL_TABLET | Freq: Every day | ORAL | Status: DC
  Administered 2019-01-21 – 2019-01-22 (×2): 500 mg via ORAL
  Filled 2019-01-21: qty 2
  Filled 2019-01-21: qty 1
  Filled 2019-01-21: qty 2

## 2019-01-21 MED ORDER — ENOXAPARIN SODIUM 30 MG/0.3ML ~~LOC~~ SOLN: 30.0000 mg | Freq: Every day | SUBCUTANEOUS | Status: DC

## 2019-01-21 MED ORDER — HYDROCOD POLST-CPM POLST ER 10-8 MG/5ML PO SUER
5.0000 mL | Freq: Two times a day (BID) | ORAL | Status: DC
  Administered 2019-01-21 – 2019-01-22 (×3): 5 mL via ORAL
  Filled 2019-01-21 (×3): qty 5

## 2019-01-21 MED ORDER — CHLORHEXIDINE GLUCONATE CLOTH 2 % EX PADS
6.0000 | MEDICATED_PAD | Freq: Every day | CUTANEOUS | Status: DC
  Administered 2019-01-21 – 2019-01-28 (×8): 6 via TOPICAL

## 2019-01-21 MED ORDER — ORAL CARE MOUTH RINSE
15.0000 mL | Freq: Two times a day (BID) | OROMUCOSAL | Status: DC
  Administered 2019-01-21 – 2019-01-28 (×17): 15 mL via OROMUCOSAL

## 2019-01-21 MED ORDER — SODIUM CHLORIDE 0.9% IV SOLUTION
Freq: Once | INTRAVENOUS | Status: AC
  Administered 2019-01-21: 15:00:00 via INTRAVENOUS

## 2019-01-21 NOTE — Progress Notes (Signed)
I spoke with patient's family over the phone, I explained them Ashley Cook's clinical situation and her plan of care.  She continues to be critically ill but stable.

## 2019-01-21 NOTE — Progress Notes (Signed)
PROGRESS NOTE    Ashley Cook  TFT:[id number] DOB: [birth year] DOA: 01/28/2019 PCP: System, Pcp Not In    Brief Narrative:  73 year old female who presented with dyspnea and cough.  She does have significant past medical history of asthma, and COPD.  She tested positive for COVID-19 November 15, she has been symptomatic with dyspnea, cough, fatigue and body aches.  On her initial physical examination her oximetry on room air was 74%, respiratory rate 30, blood pressure 125/61, heart rate 73, she was in respiratory distress, heart S1-S2 present, rhythmic, abdomen soft nontender, no lower extremity edema. Sodium 133, potassium 4.0, chloride 102, bicarb 17, glucose 103, BUN 8, creatinine 0.92, white count 9.0, hemoglobin 14.1, hematocrit 41.9, platelets 180.  COVID-19 positive.  Chest radiograph with bilateral interstitial infiltrates, right upper lobe, right lower lobe and left lower lobe.  EKG 81 bpm, normal axis, normal intervals, sinus rhythm, no ST segment changes, negative T waves V3 through V5, poor R wave progression.  Patient was admitted to the hospital with a working diagnosis of acute hypoxic respiratory failure due to SARS COVID-19 viral pneumonia.  Patient oxygen requirements, he has been receiving steroids, remdesivir, and antibiotics.  Assessment & Plan:   Principal Problem:   Pneumonia due to COVID-19 virus Active Problems:   Acute respiratory failure with hypoxia (HCC)   HLD (hyperlipidemia)   COPD (chronic obstructive pulmonary disease) ([place])   1.  Acute hypoxic respiratory failure due to SARS COVID-19 viral pneumonia/possible bacterial over infection.  RR: 22  Pulse oxymetry: 90 to 92%  Fi02: 15 LPM per non rebreather mask.  [address]    01/24/2019 1934 01/18/2019 1938 01/20/19 0511  DDIMER  --  1.15* 1.25*  FERRITIN 777*  --  843*  LDH  --  386* 345*  CRP 10.9*  --  13.0*    Lab Results  Component Value Date   SARSCOV2NAA POSITIVE (A)  01/01/2019   Horace Not Detected 01/11/2019   Patient with worsening respiratory distress, high oxygen requirements and elevated inflammatory markers. Will proceed to order COVID 19 convalescent plasma.   Continue medical therapy with Remdesivir #2/5 (AST 66 and ALT 48)and systemic corticosteroids with dexeamethasone 6 mg IV q 24H. Continue antitussive agents, bronchodilators, and airway clearing techniques with flutter valve and incentive spirometer. Encourage prone positioning.   Patient has been started on antibiotic therapy due to elevated procalcitonin and dense infiltrate on the right upper lobe. Will continue IV ceftriaxone and will change azithromycin to po. (will hold on Actemra for now)  Continue oxymetry monitoring. Patient continue to be at a very high risk for worsening respiratory failure.    2. Asthma and COPD. No signs of acute exacerbation.   3. Dehydration with hyponatremia and non gap metabolic acidosis. Na at 142 with K at 4,5, serum bicarbonate at 16, with preserved renal function, serum cr at  0,76. Will continue to hold on IV fluids, follow a restrictive IV fluids strategy.   4. Dyslipidemia. Continue with atorvastatin,   DVT prophylaxis: enoxaparin   Code Status:  full Family Communication:  No family at the bedside  Disposition Plan/ discharge barriers:  Pending clinical improvement.   Body mass index is 18.6 kg/m. Malnutrition Type:      Malnutrition Characteristics:      Nutrition Interventions:     RN Pressure Injury Documentation:     Consultants:     Procedures:     Antimicrobials:   Azithromycin   Ceftriaxone.  Subjective: Patient with positive respiratory distress, mild confusion but no agitation, positive dyspnea, no chest pain, no nausea or vomiting.   Objective: Vitals:   01/21/19 0319 01/21/19 0404 01/21/19 0414 01/21/19 0551  BP:  (!) 174/71 (!) 155/58   Pulse: 72 82 78 72  Resp: (!) 22 (!) 22 (!) 22 (!) 22   Temp:  98 F (36.7 C) 98 F (36.7 C)   TempSrc:  Axillary Axillary   SpO2: 95% 91% 90% 92%  Weight:      Height:        Intake/Output Summary (Last 24 hours) at 01/21/2019 0807 Last data filed at 01/21/2019 0600 Gross per 24 hour  Intake 900 ml  Output -  Net 900 ml   Filed Weights   01/29/2019 1910  Weight: 47.6 kg    Examination:   General: positive respiratory distress.  Neurology: Awake, mild confusion but no agitation, non focal, very deconditioned.   E ENT: positive pallor, no icterus, oral mucosa dry, positive accessory muscle use.  Cardiovascular: No JVD. S1-S2 present, rhythmic, no gallops, rubs, or murmurs. No lower extremity edema. Pulmonary: positive breath sounds bilaterally. Gastrointestinal. Abdomen with no organomegaly, non tender, no rebound or guarding Skin. No rashes Musculoskeletal: no joint deformities     Data Reviewed: I have personally reviewed following labs and imaging studies  CBC: Recent Labs  Lab 01/25/2019 1938 01/20/19 0511  WBC 9.0 10.3  NEUTROABS 7.6 9.3*  HGB 14.1 12.7  HCT 41.9 38.3  MCV 92.3 92.3  PLT 180 175   Basic Metabolic Panel: Recent Labs  Lab 01/27/2019 1938 01/20/19 0511  NA 133* 139  K 4.0 4.7  CL 102 110  CO2 17* 16*  GLUCOSE 103* 168*  BUN 8 7*  CREATININE 0.92 0.83  CALCIUM 8.4* 7.8*   GFR: Estimated Creatinine Clearance: 44.7 mL/min (by C-G formula based on SCr of 0.83 mg/dL). Liver Function Tests: Recent Labs  Lab 01/03/2019 1938 01/20/19 0511  AST 83* 66*  ALT 63* 51*  ALKPHOS 79 71  BILITOT 0.5 0.4  PROT 6.6 5.8*  ALBUMIN 3.0* 2.5*   No results for input(s): LIPASE, AMYLASE in the last 168 hours. No results for input(s): AMMONIA in the last 168 hours. Coagulation Profile: No results for input(s): INR, PROTIME in the last 168 hours. Cardiac Enzymes: No results for input(s): CKTOTAL, CKMB, CKMBINDEX, TROPONINI in the last 168 hours. BNP (last 3 results) No results for input(s): PROBNP in  the last 8760 hours. HbA1C: No results for input(s): HGBA1C in the last 72 hours. CBG: No results for input(s): GLUCAP in the last 168 hours. Lipid Profile: Recent Labs    01/29/2019 1934  TRIG 119   Thyroid Function Tests: No results for input(s): TSH, T4TOTAL, FREET4, T3FREE, THYROIDAB in the last 72 hours. Anemia Panel: Recent Labs    01/20/2019 1934 01/20/19 0511  FERRITIN 777* 843*      Radiology Studies: I have reviewed all of the imaging during this hospital visit personally     Scheduled Meds: . atorvastatin  40 mg Oral Daily  . dexamethasone (DECADRON) injection  6 mg Intravenous Q24H  . enoxaparin (LOVENOX) injection  40 mg Subcutaneous QHS  . fluticasone furoate-vilanterol  1 puff Inhalation Daily  . Ipratropium-Albuterol  1 puff Inhalation Q6H  . magic mouthwash w/lidocaine  5 mL Oral QID  . mouth rinse  15 mL Mouth Rinse BID  . montelukast  10 mg Oral Daily  . nystatin  5 mL Oral QID  .  vitamin C  500 mg Oral Daily  . zinc sulfate  220 mg Oral Daily   Continuous Infusions: . azithromycin Stopped (01/20/19 2224)  . cefTRIAXone (ROCEPHIN)  IV Stopped (01/20/19 2224)  . remdesivir 100 mg in NS 250 mL Stopped (01/20/19 1645)     LOS: 2 days         Annett Gula, MD

## 2019-01-21 NOTE — Progress Notes (Addendum)
Patient with worsening work of breathing, oxygen desaturation down to 86% despite 15 LPM per non rebreather mask.   1. Transfer to ICU 2. Heated high flow nasal cannula to target 02 saturation more than 88% 3. Stat chest radiograph 4. Stat ABG.

## 2019-01-21 NOTE — ED Notes (Signed)
RN's with [facility] informed me that the pt was in a brief and that is was soaking wet.   The pt is able to use a bedpan and has been using one since coming to the hospital. The dayshift RN Adela Lank was informed that she used a bedpan during shift change.

## 2019-01-21 NOTE — Progress Notes (Signed)
RT responded to ABG request. Patient obtunded.  ICU Charge RN called. Decision made to transfer to ICU, patient transport to ICU and placed on Heated HFNC.

## 2019-01-21 NOTE — Progress Notes (Signed)
1455  S/w pt's dtr Dorian Pod [phone];updated, advised starting plasma; dtr wants call from MD to review status and labs after plasma infusion.  Simón.Fava MD notified:  Pt w/accessory muscle use and abdominal breathing, I don't hear crackles nor see edema but looks like patients I've seen with pulmonary edema.  She's at 86% on 15L NRB and has some delayed responses.  Not taking in much and output 555mL today. Perhaps you'd consider some lasix and a chest xray?

## 2019-01-22 DIAGNOSIS — E785 Hyperlipidemia, unspecified: Secondary | ICD-10-CM | POA: Diagnosis not present

## 2019-01-22 DIAGNOSIS — J449 Chronic obstructive pulmonary disease, unspecified: Secondary | ICD-10-CM | POA: Diagnosis not present

## 2019-01-22 DIAGNOSIS — J9601 Acute respiratory failure with hypoxia: Secondary | ICD-10-CM | POA: Diagnosis not present

## 2019-01-22 DIAGNOSIS — J1289 Other viral pneumonia: Secondary | ICD-10-CM | POA: Diagnosis not present

## 2019-01-22 DIAGNOSIS — U071 COVID-19: Principal | ICD-10-CM

## 2019-01-22 LAB — PREPARE FRESH FROZEN PLASMA

## 2019-01-22 LAB — COMPREHENSIVE METABOLIC PANEL
ALT: 46 U/L — ABNORMAL HIGH (ref 0–44)
AST: 61 U/L — ABNORMAL HIGH (ref 15–41)
Albumin: 2.7 g/dL — ABNORMAL LOW (ref 3.5–5.0)
Alkaline Phosphatase: 96 U/L (ref 38–126)
Anion gap: 10 (ref 5–15)
BUN: 31 mg/dL — ABNORMAL HIGH (ref 8–23)
CO2: 17 mmol/L — ABNORMAL LOW (ref 22–32)
Calcium: 8.4 mg/dL — ABNORMAL LOW (ref 8.9–10.3)
Chloride: 117 mmol/L — ABNORMAL HIGH (ref 98–111)
Creatinine, Ser: 0.78 mg/dL (ref 0.44–1.00)
GFR calc Af Amer: >60 mL/min (ref >60)
GFR calc non Af Amer: >60 mL/min (ref >60)
Glucose, Bld: 142 mg/dL — ABNORMAL HIGH (ref 70–99)
Potassium: 4.2 mmol/L (ref 3.5–5.1)
Sodium: 144 mmol/L (ref 135–145)
Total Bilirubin: 0.3 mg/dL (ref 0.3–1.2)
Total Protein: 6.1 g/dL — ABNORMAL LOW (ref 6.5–8.1)

## 2019-01-22 LAB — BPAM FFP
Blood Product Expiration Date: 202011221348
ISSUE DATE / TIME: 202011211354
Unit Type and Rh: 5100

## 2019-01-22 LAB — C-REACTIVE PROTEIN: CRP: 7.5 mg/dL — ABNORMAL HIGH (ref <1.0)

## 2019-01-22 LAB — D-DIMER, QUANTITATIVE: D-Dimer, Quant: 2.04 ug/mL-FEU — ABNORMAL HIGH (ref 0.00–0.50)

## 2019-01-22 LAB — MRSA PCR SCREENING: MRSA by PCR: NEGATIVE

## 2019-01-22 LAB — GLUCOSE, CAPILLARY: Glucose-Capillary: 126 mg/dL — ABNORMAL HIGH (ref 70–99)

## 2019-01-22 LAB — FERRITIN: Ferritin: 640 ng/mL — ABNORMAL HIGH (ref 11–307)

## 2019-01-22 MED ORDER — ENOXAPARIN SODIUM 40 MG/0.4ML ~~LOC~~ SOLN
40.0000 mg | Freq: Two times a day (BID) | SUBCUTANEOUS | Status: DC
  Administered 2019-01-22 – 2019-01-27 (×11): 40 mg via SUBCUTANEOUS
  Filled 2019-01-22 (×11): qty 0.4

## 2019-01-22 MED ORDER — HYDRALAZINE HCL 20 MG/ML IJ SOLN: 10.0000 mg | INTRAMUSCULAR | Status: DC | PRN

## 2019-01-22 MED ORDER — TOCILIZUMAB 400 MG/20ML IV SOLN
380.0000 mg | Freq: Once | INTRAVENOUS | Status: AC
  Administered 2019-01-22: 380 mg via INTRAVENOUS
  Filled 2019-01-22: qty 19

## 2019-01-22 MED ORDER — HYDRALAZINE HCL 20 MG/ML IJ SOLN
10.0000 mg | INTRAMUSCULAR | Status: DC | PRN
  Administered 2019-01-22 – 2019-01-24 (×6): 10 mg via INTRAVENOUS
  Filled 2019-01-22 (×5): qty 1

## 2019-01-22 NOTE — Progress Notes (Signed)
PROGRESS NOTE    Ashley Cook  WCH:[id number] DOB: 1945/04/11 DOA: 01/28/2019 PCP: System, Pcp Not In    Brief Narrative:  73 year old female who presented with dyspnea and cough.  She does have significant past medical history of asthma, and COPD.  She tested positive for COVID-19 November 15, she has been symptomatic with dyspnea, cough, fatigue and body aches.  On her initial physical examination her oximetry on room air was 74%, respiratory rate 30, blood pressure 125/61, heart rate 73, she was in respiratory distress, heart S1-S2 present, rhythmic, abdomen soft nontender, no lower extremity edema. Sodium 133, potassium 4.0, chloride 102, bicarb 17, glucose 103, BUN 8, creatinine 0.92, white count 9.0, hemoglobin 14.1, hematocrit 41.9, platelets 180.  COVID-19 positive.  Chest radiograph with bilateral interstitial infiltrates, right upper lobe, right lower lobe and left lower lobe.  EKG 81 bpm, normal axis, normal intervals, sinus rhythm, no ST segment changes, negative T waves V3 through V5, poor R wave progression.  Patient was admitted to the hospital with a working diagnosis of acute hypoxic respiratory failure due to SARS COVID-19 viral pneumonia.  Patient on high oxygen requirements, she has been receiving steroids, remdesivir, and antibiotics.  Failed regular high flow nasal cannula combined with non rebreather mask, with worsening respiratory distress, received convalescent plasma (11/21), and transferred to ICU for high flow nasal cannula.    Assessment & Plan:   Principal Problem:   Pneumonia due to COVID-19 virus Active Problems:   Acute respiratory failure with hypoxia (HCC)   HLD (hyperlipidemia)   COPD (chronic obstructive pulmonary disease) (HCC)    1.  Acute hypoxic respiratory failure due to SARS COVID-19 viral pneumonia/possible bacterial over infection. SP convalescent plasma 11/21.   RR: 15 to 19  Pulse oxymetry: 92 to 94%  Fi02: 25 LPM, 70% Fi02  HFNC-non rebreather mask.  COVID-19 Labs  Recent Labs    01/28/19 1938 01/20/19 0511 01/21/19 0915 01/22/19 0110  DDIMER 1.15* 1.25* 1.60* 2.04*  FERRITIN  --  843* 740* 640*  LDH 386* 345*  --   --   CRP  --  13.0* 10.1* 7.5*    Lab Results  Component Value Date   SARSCOV2NAA POSITIVE (A) 01-28-19   SARSCOV2NAA Not Detected 01/11/2019    Patient continue to be critically ill, but tolerating well heated high flow nasal cannula. Tolerated convalescent plasma well yesterday. Chest film with interstitial infiltrates bilaterally suggestive of viral pneumonia (personally reviewed). Case discussed with Dr. Isaiah Serge, and will proceed with Actemra infusion, keep antibiotic therapy for now. Patient continue to be at very high risk for worsening respiratory failure.   On Remdesivir #3/5 (AST 61 and ALT 46 ), systemic corticosteroids with dexeamethasone 6 mg IV q 24H (no significant secondary hyperglycemia). On antitussive agents, bronchodilators, and airway clearing techniques with flutter valve and incentive spirometer.   Continue to encourage prone positiion as tolerated.   2. Asthma and COPD. No wheezing on examination, not signs of exacerbation, will continue with bronchodilator therapy, patient not on home oxygen.   3. Dehydration with hyponatremia and non gap metabolic acidosis. Serum Na stable at 144, with K at 4,2 and serum bicarbonate at 17 and Cl at 117. Persistent non anion gap metabolic acidosis. Will continue to hold on IV fluids, follow a restrictive IV fluids strategy in the setting of COVID 19 viral pneumonia.   4. Dyslipidemia. On atorvastatin,   DVT prophylaxis: enoxaparin   Code Status:  full Family Communication:  I spoke with patient family  over the phone and all questions were addressed.  Disposition Plan/ discharge barriers:  Patient critically ill, will continue supportive oxygenation with heated high flow nasal cannula to prevent imminent clinical  deterioration.   Critical care time 60 minutes.     Body mass index is 18.6 kg/m. Malnutrition Type:      Malnutrition Characteristics:      Nutrition Interventions:     RN Pressure Injury Documentation:     Consultants:   Pulmonary   Procedures:     Antimicrobials:   Ceftriaxone   Azithromycin.     Subjective: Patient continue to be very weak and deconditioned. Positive dyspnea and fatigue, no nausea or vomiting, no chest pain, tolerating po. More awake than yesterday.   Objective: Vitals:   01/22/19 0415 01/22/19 0500 01/22/19 0600 01/22/19 0700  BP: (!) 162/71 (!) 179/58 (!) 157/61 (!) 157/62  Pulse: 71 71 82 79  Resp: 15 15 19 15   Temp:      TempSrc:      SpO2: 92% 92% 95% 94%  Weight:      Height:        Intake/Output Summary (Last 24 hours) at 01/22/2019 0733 Last data filed at 01/22/2019 0600 Gross per 24 hour  Intake 1130 ml  Output 500 ml  Net 630 ml   Filed Weights   01/18/2019 1910  Weight: 47.6 kg    Examination:   General: deconditioned and ill looking appearing, respiratory distress compared from yesterday has decreased.  Neurology: Awake, but fatigued.  E ENT: positive pallor, no icterus, oral mucosa moist Cardiovascular: No JVD. S1-S2 present, rhythmic, no gallops, rubs, or murmurs. No lower extremity edema. Pulmonary: positive breath sounds bilaterally, no wheezing, rhonchi or rales. Gastrointestinal. Abdomen with no organomegaly, non tender, no rebound or guarding Skin. No rashes Musculoskeletal: no joint deformities     Data Reviewed: I have personally reviewed following labs and imaging studies  CBC: Recent Labs  Lab 01/24/2019 1938 01/20/19 0511 01/21/19 0915 01/21/19 1856  WBC 9.0 10.3 15.1*  --   NEUTROABS 7.6 9.3* 12.9*  --   HGB 14.1 12.7 13.4 11.6*  HCT 41.9 38.3 40.3 34.0*  MCV 92.3 92.3 91.0  --   PLT 180 175 262  --    Basic Metabolic Panel: Recent Labs  Lab 01/01/2019 1938 01/20/19 0511  01/21/19 0915 01/21/19 1856 01/22/19 0110  NA 133* 139 142 144 144  K 4.0 4.7 4.5 4.1 4.2  CL 102 110 113*  --  117*  CO2 17* 16* 16*  --  17*  GLUCOSE 103* 168* 120*  --  142*  BUN 8 7* 25*  --  31*  CREATININE 0.92 0.83 0.76  --  0.78  CALCIUM 8.4* 7.8* 8.2*  --  8.4*   GFR: Estimated Creatinine Clearance: 46.4 mL/min (by C-G formula based on SCr of 0.78 mg/dL). Liver Function Tests: Recent Labs  Lab 01/26/2019 1938 01/20/19 0511 01/21/19 0915 01/22/19 0110  AST 83* 66* 66* 61*  ALT 63* 51* 48* 46*  ALKPHOS 79 71 84 96  BILITOT 0.5 0.4 0.6 0.3  PROT 6.6 5.8* 6.2* 6.1*  ALBUMIN 3.0* 2.5* 2.7* 2.7*   No results for input(s): LIPASE, AMYLASE in the last 168 hours. No results for input(s): AMMONIA in the last 168 hours. Coagulation Profile: No results for input(s): INR, PROTIME in the last 168 hours. Cardiac Enzymes: No results for input(s): CKTOTAL, CKMB, CKMBINDEX, TROPONINI in the last 168 hours. BNP (last 3 results) No results  for input(s): PROBNP in the last 8760 hours. HbA1C: No results for input(s): HGBA1C in the last 72 hours. CBG: Recent Labs  Lab 01/22/19 0251  GLUCAP 126*   Lipid Profile: Recent Labs    2019-02-17 1934  TRIG 119   Thyroid Function Tests: No results for input(s): TSH, T4TOTAL, FREET4, T3FREE, THYROIDAB in the last 72 hours. Anemia Panel: Recent Labs    01/21/19 0915 01/22/19 0110  FERRITIN 740* 640*      Radiology Studies: I have reviewed all of the imaging during this hospital visit personally     Scheduled Meds: . atorvastatin  40 mg Oral Daily  . azithromycin  500 mg Oral Daily  . Chlorhexidine Gluconate Cloth  6 each Topical Daily  . chlorpheniramine-HYDROcodone  5 mL Oral Q12H  . dexamethasone (DECADRON) injection  6 mg Intravenous Q24H  . enoxaparin (LOVENOX) injection  40 mg Subcutaneous QHS  . fluticasone furoate-vilanterol  1 puff Inhalation Daily  . Ipratropium-Albuterol  1 puff Inhalation Q6H  . magic  mouthwash w/lidocaine  5 mL Oral QID  . mouth rinse  15 mL Mouth Rinse BID  . montelukast  10 mg Oral Daily  . nystatin  5 mL Oral QID  . vitamin C  500 mg Oral Daily  . zinc sulfate  220 mg Oral Daily   Continuous Infusions: . cefTRIAXone (ROCEPHIN)  IV 2 g (01/21/19 1938)  . remdesivir 100 mg in NS 250 mL 100 mg (01/21/19 0914)     LOS: 3 days        Ashley Dona Gerome Apley, MD

## 2019-01-22 NOTE — Progress Notes (Signed)
Pt's son Jenny Reichmann called to unit. Updated of pt condition and plan of care. John appreciates the update.

## 2019-01-22 NOTE — Consult Note (Signed)
NAME:  Ashley Cook, MRN:  [id number], DOB:  March 16, 1945, LOS: 3 ADMISSION DATE:  01/29/2019, CONSULTATION DATE:  01/22/2019 REFERRING MD: Erin Hearing MD, CHIEF COMPLAINT: COVID-19 pneumonia.  Brief History   73 year old with asthma, COPD admitted with COVID-19 pneumonia. Treated with steroids, remdesivir, antibiotics.  Transferred to ICU on 11/21 for worsening hypoxia.   Past Medical History  Asthma, COPD, hyperlipidemia  Significant Hospital Events   11/15-Tested Covid positive 11/19- Admit to [facility] 11/21- Transfer to ICU  Consults:  PCCM  Procedures:    Significant Diagnostic Tests:  Chest x-ray 11/21-bilateral airspace opacities with mild progression in the left midlung zone.  I reviewed the images personally.  Micro Data:  Blood cultures 11/19-negative  Antimicrobials:  Ceftriaxone 11/19>> Azithromycin 11/19 >> Remdesivir 11/20 >> Decadron 11/19 >>  Interim history/subjective:    Objective   Blood pressure (!) 157/62, pulse 79, temperature (!) 97.1 F (36.2 C), temperature source Axillary, resp. rate 15, height 5\' 3"  (1.6 m), weight 47.6 kg, SpO2 94 %.    FiO2 (%):  [70 %-100 %] 70 %   Intake/Output Summary (Last 24 hours) at 01/22/2019 0817 Last data filed at 01/22/2019 0600 Gross per 24 hour  Intake 1130 ml  Output 500 ml  Net 630 ml   Filed Weights   01/06/2019 1910  Weight: 47.6 kg    Examination: Blood pressure (!) 160/65, pulse 85, temperature (!) 97.1 F (36.2 C), temperature source Axillary, resp. rate (!) 22, height 5\' 3"  (1.6 m), weight 47.6 kg, SpO2 92 %. Gen:      Frail, fatigued HEENT:  EOMI, sclera anicteric Neck:     No masses; no thyromegaly Lungs:    B/L rhonchi CV:         Regular rate and rhythm; no murmurs Abd:      + bowel sounds; soft, non-tender; no palpable masses, no distension Ext:    No edema; adequate peripheral perfusion Skin:      Warm and dry; no rash Neuro: Somnolent arousable  Resolved Hospital Problem  list     Assessment & Plan:  COVID-19 pneumonia with hypoxic respiratory failure COPD, asthma Heated high flow.  Monitor for intubation needs. Awake prone positioning Continue Breo, Incruse Incentive spirometer On empiric coverage for community-acquired pneumonia Continue remdesivir, steroids.  Best practice:  Diet: PO Pain/Anxiety/Delirium protocol (if indicated): NA VAP protocol (if indicated): NA DVT prophylaxis: Lovenox GI prophylaxis: NA Glucose control: Monitor sugars Mobility: Bed Code Status: Full Family Communication: per [facility] Disposition: ICU  Labs   CBC: Recent Labs  Lab 01/06/2019 1938 01/20/19 0511 01/21/19 0915 01/21/19 1856  WBC 9.0 10.3 15.1*  --   NEUTROABS 7.6 9.3* 12.9*  --   HGB 14.1 12.7 13.4 11.6*  HCT 41.9 38.3 40.3 34.0*  MCV 92.3 92.3 91.0  --   PLT 180 175 262  --     Basic Metabolic Panel: Recent Labs  Lab 01/23/2019 1938 01/20/19 0511 01/21/19 0915 01/21/19 1856 01/22/19 0110  NA 133* 139 142 144 144  K 4.0 4.7 4.5 4.1 4.2  CL 102 110 113*  --  117*  CO2 17* 16* 16*  --  17*  GLUCOSE 103* 168* 120*  --  142*  BUN 8 7* 25*  --  31*  CREATININE 0.92 0.83 0.76  --  0.78  CALCIUM 8.4* 7.8* 8.2*  --  8.4*   GFR: Estimated Creatinine Clearance: 46.4 mL/min (by C-G formula based on SCr of 0.78 mg/dL). Recent Labs  Lab 01/09/2019  1938 06-Jul-2018 1940 06-Jul-2018 2134 06-Jul-2018 2344 01/20/19 0511 01/20/19 0929 01/21/19 0915  PROCALCITON 1.21  --   --   --   --  0.62  --   WBC 9.0  --   --   --  10.3  --  15.1*  LATICACIDVEN  --  1.6 2.0* 1.5  --   --   --     Liver Function Tests: Recent Labs  Lab 06-Jul-2018 1938 01/20/19 0511 01/21/19 0915 01/22/19 0110  AST 83* 66* 66* 61*  ALT 63* 51* 48* 46*  ALKPHOS 79 71 84 96  BILITOT 0.5 0.4 0.6 0.3  PROT 6.6 5.8* 6.2* 6.1*  ALBUMIN 3.0* 2.5* 2.7* 2.7*   No results for input(s): LIPASE, AMYLASE in the last 168 hours. No results for input(s): AMMONIA in the last 168 hours.  ABG     Component Value Date/Time   PHART 7.365 01/21/2019 1856   PCO2ART 29.1 (L) 01/21/2019 1856   PO2ART 48.0 (L) 01/21/2019 1856   HCO3 16.7 (L) 01/21/2019 1856   TCO2 18 (L) 01/21/2019 1856   ACIDBASEDEF 8.0 (H) 01/21/2019 1856   O2SAT 83.0 01/21/2019 1856     Coagulation Profile: No results for input(s): INR, PROTIME in the last 168 hours.  Cardiac Enzymes: No results for input(s): CKTOTAL, CKMB, CKMBINDEX, TROPONINI in the last 168 hours.  HbA1C: No results found for: HGBA1C  CBG: Recent Labs  Lab 01/22/19 0251  GLUCAP 126*    Review of Systems:   REVIEW OF SYSTEMS:   All negative; except for those that are bolded, which indicate positives.  Constitutional: weight loss, weight gain, night sweats, fevers, chills, fatigue, weakness.  HEENT: headaches, sore throat, sneezing, nasal congestion, post nasal drip, difficulty swallowing, tooth/dental problems, visual complaints, visual changes, ear aches. Neuro: difficulty with speech, weakness, numbness, ataxia. CV:  chest pain, orthopnea, PND, swelling in lower extremities, dizziness, palpitations, syncope.  Resp: cough, hemoptysis, dyspnea, wheezing. GI: heartburn, indigestion, abdominal pain, nausea, vomiting, diarrhea, constipation, change in bowel habits, loss of appetite, hematemesis, melena, hematochezia.  GU: dysuria, change in color of urine, urgency or frequency, flank pain, hematuria. MSK: joint pain or swelling, decreased range of motion. Psych: change in mood or affect, depression, anxiety, suicidal ideations, homicidal ideations. Skin: rash, itching, bruising.  Past Medical History  She,  has a past medical history of Asthma and COPD (chronic obstructive pulmonary disease) (HCC).   Surgical History   History reviewed. No pertinent surgical history.   Social History   reports that she has quit smoking. Her smoking use included cigarettes. She quit after 25.00 years of use. She has never used smokeless tobacco. She  reports that she does not drink alcohol or use drugs.   Family History   Her family history includes Asthma in her mother; Heart disease in her father.   Allergies No Known Allergies   Home Medications  Prior to Admission medications   Medication Sig Start Date End Date Taking? Authorizing Provider  atorvastatin (LIPITOR) 40 MG tablet Take 40 mg by mouth daily. 10/15/18  Yes [provider]  BREO ELLIPTA 200-25 MCG/INH AEPB Inhale 1 puff into the lungs daily. 12/26/18  Yes [provider]  montelukast (SINGULAIR) 10 MG tablet Take 10 mg by mouth daily. 12/14/18  Yes [provider]  nystatin (MYCOSTATIN) 100000 UNIT/ML suspension Take 15 mLs by mouth See admin instructions. Swish and swallow 15mls bid. 01/18/19  Yes [provider]  ondansetron (ZOFRAN) 4 MG tablet Take 4 mg  by mouth every 6 (six) hours as needed for nausea/vomiting. 01/18/19  Yes [provider]  SPIRIVA RESPIMAT 2.5 MCG/ACT AERS Inhale 2 puffs into the lungs daily. 12/26/18  Yes [provider]     Critical care time: 44    The patient is critically ill with multiple organ system failure and requires high complexity decision making for assessment and support, frequent evaluation and titration of therapies, advanced monitoring, review of radiographic studies and interpretation of complex data.   Critical Care Time devoted to patient care services, exclusive of separately billable procedures, described in this note is 35 minutes.   Marshell Garfinkel MD [facility] Pager [phone] If no answer call [phone] 01/22/2019, 8:26 AM

## 2019-01-22 NOTE — Progress Notes (Signed)
eLink Physician-Brief Progress Note Patient Name: Ashley Cook DOB: 1945/08/19 MRN: [id number]   Date of Service  01/22/2019  HPI/Events of Note  Hypertension - BP = 162/71.  eICU Interventions  Will order: 1. Hydralazine 10 mg IV Q 4 hours PRN SBP > 170.     Intervention Category Major Interventions: Hypertension - evaluation and management  Sommer,Steven Eugene 01/22/2019, 4:44 AM

## 2019-01-23 ENCOUNTER — Inpatient Hospital Stay (HOSPITAL_COMMUNITY): Payer: Medicare Other

## 2019-01-23 DIAGNOSIS — U071 COVID-19: Secondary | ICD-10-CM | POA: Diagnosis present

## 2019-01-23 DIAGNOSIS — J1289 Other viral pneumonia: Secondary | ICD-10-CM | POA: Diagnosis not present

## 2019-01-23 DIAGNOSIS — J449 Chronic obstructive pulmonary disease, unspecified: Secondary | ICD-10-CM

## 2019-01-23 LAB — PROCALCITONIN: Procalcitonin: 0.24 ng/mL

## 2019-01-23 LAB — COMPREHENSIVE METABOLIC PANEL
ALT: 46 U/L — ABNORMAL HIGH (ref 0–44)
AST: 64 U/L — ABNORMAL HIGH (ref 15–41)
Albumin: 2.7 g/dL — ABNORMAL LOW (ref 3.5–5.0)
Alkaline Phosphatase: 126 U/L (ref 38–126)
Anion gap: 14 (ref 5–15)
BUN: 33 mg/dL — ABNORMAL HIGH (ref 8–23)
CO2: 14 mmol/L — ABNORMAL LOW (ref 22–32)
Calcium: 8.4 mg/dL — ABNORMAL LOW (ref 8.9–10.3)
Chloride: 121 mmol/L — ABNORMAL HIGH (ref 98–111)
Creatinine, Ser: 0.75 mg/dL (ref 0.44–1.00)
GFR calc Af Amer: >60 mL/min (ref >60)
GFR calc non Af Amer: >60 mL/min (ref >60)
Glucose, Bld: 146 mg/dL — ABNORMAL HIGH (ref 70–99)
Potassium: 5 mmol/L (ref 3.5–5.1)
Sodium: 149 mmol/L — ABNORMAL HIGH (ref 135–145)
Total Bilirubin: 0.8 mg/dL (ref 0.3–1.2)
Total Protein: 6.3 g/dL — ABNORMAL LOW (ref 6.5–8.1)

## 2019-01-23 LAB — POCT I-STAT 7, (LYTES, BLD GAS, ICA,H+H)
Acid-base deficit: 7 mmol/L — ABNORMAL HIGH (ref 0.0–2.0)
Bicarbonate: 16.2 mmol/L — ABNORMAL LOW (ref 20.0–28.0)
Calcium, Ion: 1.25 mmol/L (ref 1.15–1.40)
HCT: 36 % (ref 36.0–46.0)
Hemoglobin: 12.2 g/dL (ref 12.0–15.0)
O2 Saturation: 82 %
Patient temperature: 97.8
Potassium: 3.8 mmol/L (ref 3.5–5.1)
Sodium: 150 mmol/L — ABNORMAL HIGH (ref 135–145)
TCO2: 17 mmol/L — ABNORMAL LOW (ref 22–32)
pCO2 arterial: 25.6 mmHg — ABNORMAL LOW (ref 32.0–48.0)
pH, Arterial: 7.407 (ref 7.350–7.450)
pO2, Arterial: 44 mmHg — ABNORMAL LOW (ref 83.0–108.0)

## 2019-01-23 LAB — C-REACTIVE PROTEIN: CRP: 5.1 mg/dL — ABNORMAL HIGH (ref <1.0)

## 2019-01-23 LAB — BRAIN NATRIURETIC PEPTIDE: B Natriuretic Peptide: 1177.4 pg/mL — ABNORMAL HIGH (ref 0.0–100.0)

## 2019-01-23 LAB — GLUCOSE, CAPILLARY: Glucose-Capillary: 116 mg/dL — ABNORMAL HIGH (ref 70–99)

## 2019-01-23 LAB — D-DIMER, QUANTITATIVE: D-Dimer, Quant: 1.95 ug/mL-FEU — ABNORMAL HIGH (ref 0.00–0.50)

## 2019-01-23 LAB — FERRITIN: Ferritin: 562 ng/mL — ABNORMAL HIGH (ref 11–307)

## 2019-01-23 MED ORDER — DEXTROSE 5 % IV SOLN
INTRAVENOUS | Status: AC
  Administered 2019-01-23: 08:00:00 via INTRAVENOUS

## 2019-01-23 MED ORDER — IPRATROPIUM-ALBUTEROL 20-100 MCG/ACT IN AERS
1.0000 | INHALATION_SPRAY | Freq: Three times a day (TID) | RESPIRATORY_TRACT | Status: DC
  Filled 2019-01-23: qty 4

## 2019-01-23 MED ORDER — SODIUM CHLORIDE 0.9 % IV SOLN
3.0000 g | Freq: Four times a day (QID) | INTRAVENOUS | Status: DC
  Administered 2019-01-23 – 2019-01-29 (×26): 3 g via INTRAVENOUS
  Filled 2019-01-23: qty 8
  Filled 2019-01-23: qty 3
  Filled 2019-01-23: qty 8
  Filled 2019-01-23 (×5): qty 3
  Filled 2019-01-23: qty 8
  Filled 2019-01-23: qty 3
  Filled 2019-01-23: qty 8
  Filled 2019-01-23 (×2): qty 3
  Filled 2019-01-23 (×8): qty 8
  Filled 2019-01-23: qty 3
  Filled 2019-01-23 (×4): qty 8
  Filled 2019-01-23: qty 3
  Filled 2019-01-23: qty 8

## 2019-01-23 MED ORDER — METHYLPREDNISOLONE SODIUM SUCC 125 MG IJ SOLR
60.0000 mg | Freq: Two times a day (BID) | INTRAMUSCULAR | Status: DC
  Administered 2019-01-23 – 2019-01-29 (×14): 60 mg via INTRAVENOUS
  Filled 2019-01-23 (×15): qty 2

## 2019-01-23 MED ORDER — IPRATROPIUM-ALBUTEROL 20-100 MCG/ACT IN AERS: 1.0000 | INHALATION_SPRAY | Freq: Four times a day (QID) | RESPIRATORY_TRACT | Status: DC | PRN

## 2019-01-23 MED ORDER — FUROSEMIDE 10 MG/ML IJ SOLN
40.0000 mg | Freq: Once | INTRAMUSCULAR | Status: AC
  Administered 2019-01-23: 40 mg via INTRAVENOUS
  Filled 2019-01-23: qty 4

## 2019-01-23 MED ORDER — SODIUM POLYSTYRENE SULFONATE 15 GM/60ML PO SUSP: 15.0000 g | Freq: Once | ORAL | Status: DC

## 2019-01-23 MED ORDER — CLONIDINE HCL 0.2 MG/24HR TD PTWK
0.2000 mg | MEDICATED_PATCH | TRANSDERMAL | Status: DC
  Administered 2019-01-23: 0.2 mg via TRANSDERMAL
  Filled 2019-01-23 (×2): qty 1

## 2019-01-23 MED ORDER — ACETAMINOPHEN 650 MG RE SUPP: 650.0000 mg | Freq: Three times a day (TID) | RECTAL | Status: DC | PRN

## 2019-01-23 MED ORDER — PANTOPRAZOLE SODIUM 40 MG IV SOLR
40.0000 mg | INTRAVENOUS | Status: DC
  Administered 2019-01-23 – 2019-01-28 (×6): 40 mg via INTRAVENOUS
  Filled 2019-01-23 (×6): qty 40

## 2019-01-23 NOTE — Progress Notes (Signed)
MD Candiss Norse stated he spoke with the family and verbally ordered pt to be DNR, charge RN Estill Bamberg was also notified of code change. 01/23/2019 0915 Tawanna Sat, RN

## 2019-01-23 NOTE — Progress Notes (Signed)
Assisted tele visit to patient with family member.  Rydan Gulyas M, RN  

## 2019-01-23 NOTE — Plan of Care (Signed)
Pt drowsy and orientated x1, unable to educate at this time.

## 2019-01-23 NOTE — Progress Notes (Signed)
PROGRESS NOTE                                                                                                                                                                                                             Patient Demographics:    Ashley Cook, is a 73 y.o. female, DOB - Oct 18, 1945, MBW:[id number]  Outpatient Primary MD for the patient is System, Pcp Not In    LOS - 4  Admit date - 01/02/2019    CC - SOB     Brief Narrative  73 year old female who presented with dyspnea and cough. She does have significant past medical history of asthma, and COPD. She tested positive for COVID-19 November 15, she has been symptomatic with dyspnea, cough, fatigue and body aches, she had the symptoms for 3 to 4 days and when she became extremely short of breath she came to the ER where she was diagnosed with acute hypoxic respiratory failure secondary to COVID-19 pneumonia she was placed on 4 L oxygen and was started on appropriate treatment and sent to [facility].   Subjective:    Ashley Cook today in bed on heated high flow appears lethargic with coarse audible breath sounds.  Minimally responsive.   Assessment  & Plan :     1. Acute Hypoxic Resp. Failure due to Acute Covid 19 Viral Pneumonitis during the ongoing 2020 Covid 19 Pandemic - she has severe disease and unfortunately she waited at home for several days with shortness of breath before presenting to the hospital.  She was seen by my partners and given appropriate treatment which includes remdesivir, convalescent plasma and Actemra.  Unfortunately it looks like her disease had already progressed quite a bit prior to presentation.  She is currently requiring 30 L of high flow nasal cannula oxygen with extremely poor oxygen saturation, she is appearing more and more lethargic with coarse bilateral breath sounds.  I also suspect that she is microaspirating at this point.   Her prognosis appears extremely grim.  I discussed her case with her family in detail plan is to continue aggressive medical treatment however if she declines despite doing this we will focus on keeping her comfortable.  She will be DNR as I think intubating her will harm her more than benefit her in the long run.  I discussed this with pulmonary physician  Dr. Lake Bells as well.  Will use I-S and flutter valve as tolerated for pulmonary toiletry.   SpO2: (!) 89 % O2 Flow Rate (L/min): 30 L/min FiO2 (%): 90 %  Hepatic Function Latest Ref Rng & Units 01/23/2019 01/22/2019 01/21/2019  Total Protein 6.5 - 8.1 g/dL 6.3(L) 6.1(L) 6.2(L)  Albumin 3.5 - 5.0 g/dL 2.7(L) 2.7(L) 2.7(L)  AST 15 - 41 U/L 64(H) 61(H) 66(H)  ALT 0 - 44 U/L 46(H) 46(H) 48(H)  Alk Phosphatase 38 - 126 U/L 126 96 84  Total Bilirubin 0.3 - 1.2 mg/dL 0.8 0.3 0.6    FiO2 (%):  [80 %-100 %] 90 %  ABG     Component Value Date/Time   PHART 7.407 01/23/2019 0818   PCO2ART 25.6 (L) 01/23/2019 0818   PO2ART 44.0 (L) 01/23/2019 0818   HCO3 16.2 (L) 01/23/2019 0818   TCO2 17 (L) 01/23/2019 0818   ACIDBASEDEF 7.0 (H) 01/23/2019 0818   O2SAT 82.0 01/23/2019 0818    COVID-19 Labs  Recent Labs    01/21/19 0915 01/22/19 0110 01/23/19 0445  DDIMER 1.60* 2.04* 1.95*  FERRITIN 740* 640* 562*  CRP 10.1* 7.5* 5.1*    Lab Results  Component Value Date   SARSCOV2NAA POSITIVE (A) 01/02/2019   Weir Not Detected 01/11/2019        Component Value Date/Time   BNP 1,177.4 (H) 01/23/2019 0500      2.  Elevated proBNP.  Does appear to be intravascularly dehydrated with third spaced fluid, Lasix IV and monitor.  No echo on file.  3.  Toxic and metabolic encephalopathy.  N.p.o. except medications for now.  4.  Concern for ongoing microaspiration due to encephalopathy.  Unasyn.  N.p.o. except meds.  Trend procalcitonin.  5.  Underlying COPD.  Supportive care.  6.  Dyslipidemia.  Statin once she is able to take  p.o.  7.  Hypernatremia.  Likely due to intravascular dehydration, she has evidence of third spacing and proBNP is elevated along with rails on exam.  D5W gentle unfortunately she will have to get Lasix at this time due to her respiratory compromise.  Will hydrate as we are able to.      Condition - Extremely Guarded  Family Communication  : Discussed with husband, daughter and son over the phone on 01/23/2019 in detail.  Plan is continue medical treatment.  DNR.  If she declines then focus on comfort.  Code Status : DNR  Diet : N.p.o. except medications  Disposition Plan  : ICU  Consults  :  None  Procedures  :     PUD Prophylaxis : Protonix IV.  DVT Prophylaxis  :  Lovenox   Lab Results  Component Value Date   PLT 262 01/21/2019    Inpatient Medications  Scheduled Meds: . atorvastatin  40 mg Oral Daily  . azithromycin  500 mg Oral Daily  . Chlorhexidine Gluconate Cloth  6 each Topical Daily  . chlorpheniramine-HYDROcodone  5 mL Oral Q12H  . dexamethasone (DECADRON) injection  6 mg Intravenous Q24H  . enoxaparin (LOVENOX) injection  40 mg Subcutaneous BID  . fluticasone furoate-vilanterol  1 puff Inhalation Daily  . Ipratropium-Albuterol  1 puff Inhalation TID  . magic mouthwash w/lidocaine  5 mL Oral QID  . mouth rinse  15 mL Mouth Rinse BID  . montelukast  10 mg Oral Daily  . nystatin  5 mL Oral QID  . sodium polystyrene  15 g Oral Once  . vitamin C  500 mg  Oral Daily  . zinc sulfate  220 mg Oral Daily   Continuous Infusions: . cefTRIAXone (ROCEPHIN)  IV 2 g (01/22/19 2101)  . dextrose 125 mL/hr at 01/23/19 0819  . remdesivir 100 mg in NS 250 mL Stopped (01/22/19 1003)   PRN Meds:.acetaminophen, albuterol, guaiFENesin-dextromethorphan, hydrALAZINE  Antibiotics  :    Anti-infectives (From admission, onward)   Start     Dose/Rate Route Frequency Ordered Stop   01/21/19 1000  remdesivir 100 mg in sodium chloride 0.9 % 250 mL IVPB  Status:  Discontinued      100 mg 500 mL/hr over 30 Minutes Intravenous Every 24 hours 01/20/19 0008 01/20/19 1015   01/21/19 1000  azithromycin (ZITHROMAX) tablet 500 mg     500 mg Oral Daily 01/21/19 0925 01/23/19 2359   01/20/19 1800  fluconazole (DIFLUCAN) tablet 150 mg     150 mg Oral  Once 01/20/19 1631 01/20/19 1828   01/20/19 1600  remdesivir 100 mg in sodium chloride 0.9 % 250 mL IVPB     100 mg 500 mL/hr over 30 Minutes Intravenous Daily 01/20/19 1015 01/24/19 0959   01/20/19 0015  remdesivir 200 mg in sodium chloride 0.9 % 250 mL IVPB     200 mg 500 mL/hr over 30 Minutes Intravenous Once 01/20/19 0008 01/20/19 0141   01/15/2019 2045  cefTRIAXone (ROCEPHIN) 2 g in sodium chloride 0.9 % 100 mL IVPB     2 g 200 mL/hr over 30 Minutes Intravenous Every 24 hours 01/22/2019 2032 01/24/19 2159   01/23/2019 2045  azithromycin (ZITHROMAX) 500 mg in sodium chloride 0.9 % 250 mL IVPB  Status:  Discontinued     500 mg 250 mL/hr over 60 Minutes Intravenous Every 24 hours 01/09/2019 2032 01/21/19 0925       Time Spent in minutes  30   Lala Lund M.D on 01/23/2019 at 10:20 AM  To page go to [email or web address] - password [facility]  [facility] -  Office  [phone]  See all Orders from today for further details    Objective:   Vitals:   01/23/19 0630 01/23/19 0700 01/23/19 0800 01/23/19 0822  BP: (!) 165/69 (!) 160/70  (!) 169/73  Pulse: 89 92  99  Resp: 18 16  (!) 21  Temp:   97.8 F (36.6 C)   TempSrc:   Oral   SpO2: (!) 88% 90%  (!) 89%  Weight:      Height:        Wt Readings from Last 3 Encounters:  01/09/2019 47.6 kg     Intake/Output Summary (Last 24 hours) at 01/23/2019 1020 Last data filed at 01/23/2019 0600 Gross per 24 hour  Intake 613.22 ml  Output 275 ml  Net 338.22 ml     Physical Exam  Awake but lethargic, No new F.N deficits, Normal affect [place].AT,PERRAL Supple Neck,No JVD, No cervical lymphadenopathy appriciated.  Symmetrical Chest wall movement, Good air movement  bilaterally, Coarse breath sounds with bibasilar Rales RRR,No Gallops,Rubs or new Murmurs, No Parasternal Heave +ve B.Sounds, Abd Soft, No tenderness, No organomegaly appriciated, No rebound - guarding or rigidity. No Cyanosis, Clubbing or edema, No new Rash or bruise     Data Review:    CBC Recent Labs  Lab 01/10/2019 1938 01/20/19 0511 01/21/19 0915 01/21/19 1856 01/23/19 0818  WBC 9.0 10.3 15.1*  --   --   HGB 14.1 12.7 13.4 11.6* 12.2  HCT 41.9 38.3 40.3 34.0* 36.0  PLT 180 175 262  --   --  MCV 92.3 92.3 91.0  --   --   MCH 31.1 30.6 30.2  --   --   MCHC 33.7 33.2 33.3  --   --   RDW 14.6 14.7 15.1  --   --   LYMPHSABS 1.0 0.7 1.3  --   --   MONOABS 0.3 0.2 0.7  --   --   EOSABS 0.0 0.0 0.0  --   --   BASOSABS 0.0 0.0 0.0  --   --     Chemistries  Recent Labs  Lab 01/09/2019 1938 01/20/19 0511 01/21/19 0915 01/21/19 1856 01/22/19 0110 01/23/19 0445 01/23/19 0818  NA 133* 139 142 144 144 149* 150*  K 4.0 4.7 4.5 4.1 4.2 5.0 3.8  CL 102 110 113*  --  117* 121*  --   CO2 17* 16* 16*  --  17* 14*  --   GLUCOSE 103* 168* 120*  --  142* 146*  --   BUN 8 7* 25*  --  31* 33*  --   CREATININE 0.92 0.83 0.76  --  0.78 0.75  --   CALCIUM 8.4* 7.8* 8.2*  --  8.4* 8.4*  --   AST 83* 66* 66*  --  61* 64*  --   ALT 63* 51* 48*  --  46* 46*  --   ALKPHOS 79 71 84  --  96 126  --   BILITOT 0.5 0.4 0.6  --  0.3 0.8  --    ------------------------------------------------------------------------------------------------------------------ No results for input(s): CHOL, HDL, LDLCALC, TRIG, CHOLHDL, LDLDIRECT in the last 72 hours.  No results found for: HGBA1C ------------------------------------------------------------------------------------------------------------------ No results for input(s): TSH, T4TOTAL, T3FREE, THYROIDAB in the last 72 hours.  Invalid input(s): FREET3  Cardiac Enzymes No results for input(s): CKMB, TROPONINI, MYOGLOBIN in the last 168 hours.   Invalid input(s): CK ------------------------------------------------------------------------------------------------------------------    Component Value Date/Time   BNP 1,177.4 (H) 01/23/2019 0500    Micro Results Recent Results (from the past 240 hour(s))  Blood Culture (routine x 2)     Status: None (Preliminary result)   Collection Time: 01/14/2019  7:34 PM   Specimen: BLOOD  Result Value Ref Range Status   Specimen Description   Final    BLOOD LEFT ANTECUBITAL Performed at [facility] [address] [place], [place] [address]    Special Requests   Final    BOTTLES DRAWN AEROBIC AND ANAEROBIC Blood Culture adequate volume Performed at [facility] [address] [place], [place] [address]    Culture   Final    NO GROWTH 4 DAYS Performed at [facility], [place] [address], [place], [place] [address]    Report Status PENDING  Incomplete  Blood Culture (routine x 2)     Status: None (Preliminary result)   Collection Time: 01/20/2019  7:34 PM   Specimen: BLOOD LEFT FOREARM  Result Value Ref Range Status   Specimen Description   Final    BLOOD LEFT FOREARM Performed at [facility], [place] [address], [place], [place] [address]    Special Requests   Final    BOTTLES DRAWN AEROBIC AND ANAEROBIC Blood Culture results may not be optimal due to an inadequate volume of blood received in culture bottles Performed at [facility] [address], [place], [place] [address]    Culture   Final    NO GROWTH 4 DAYS Performed at [facility], [address] [place], [place] [address]    Report Status PENDING  Incomplete  SARS CORONAVIRUS 2 (TAT 6-24 HRS) Nasopharyngeal Nasopharyngeal Swab     Status: Abnormal   Collection Time: 01/15/2019 11:44 PM   Specimen: Nasopharyngeal Swab  Result Value Ref Range Status   SARS Coronavirus 2 POSITIVE (A) NEGATIVE Final    Comment: RESULT CALLED TO, READ BACK BY AND VERIFIED  WITH: Marisa Hua RN 9:40 01/20/19 ([email or web address]) (NOTE) SARS-CoV-2 target nucleic acids are DETECTED. The SARS-CoV-2 RNA is generally detectable in upper and lower respiratory specimens during the acute phase of infection. Positive results are indicative of active infection with SARS-CoV-2. Clinical  correlation with patient history and other diagnostic information is necessary to determine patient infection status. Positive results do  not rule out bacterial infection or co-infection with other viruses. The expected result is Negative. Fact Sheet for Patients: SugarRoll.be Fact Sheet for Healthcare Providers: https://www.woods-mathews.com/ This test is not yet approved or cleared by the [place] FDA and  has been authorized for detection and/or diagnosis of SARS-CoV-2 by FDA under an Emergency Use Authorization (EUA). This EUA will remain  in effect (meaning this test can be used) for  the duration of the COVID-19 declaration under Section 564(b)(1) of the Act, 21 U.S.C. section 360bbb-3(b)(1), unless the authorization is terminated or revoked sooner. Performed at [facility], [place] [address], [place], [place] [address]   MRSA PCR Screening     Status: None   Collection Time: 01/22/19  2:57 AM   Specimen: Nasal Mucosa; Nasopharyngeal  Result Value Ref Range Status   MRSA by PCR NEGATIVE NEGATIVE Final    Comment:        The GeneXpert MRSA Assay (FDA approved for NASAL specimens only), is one component of a comprehensive MRSA colonization surveillance program. It is not intended to diagnose MRSA infection nor to guide or monitor treatment for MRSA infections. Performed at [facility], [place] [address], [place], [place] [address]     Radiology Reports Dg Chest 1 View  Result Date: 01/23/2019 CLINICAL DATA:  73 year old female with history of oxygen desaturation. EXAM: CHEST  1 VIEW COMPARISON:  Chest x-ray  01/21/2019. FINDINGS: Hazy ill-defined opacities and widespread areas of interstitial prominence are noted throughout the lungs bilaterally, asymmetrically distributed, most severe throughout the left mid to lower lung. This distribution has shifted over the past several days, likely to reflect evolving multilobar atypical pneumonia. No pleural effusions. No evidence of pulmonary edema. Heart size is normal. Upper mediastinal contours are within normal limits. Aortic atherosclerosis. IMPRESSION: 1. Multilobar bilateral pneumonia with similar aeration compared to the recent prior study from January 21, 2019, as above. 2. Aortic atherosclerosis. Electronically Signed   By: Vinnie Langton M.D.   On: 01/23/2019 09:16   Dg Chest 1 View  Result Date: 01/21/2019 CLINICAL DATA:  Pneumonia EXAM: CHEST  1 VIEW COMPARISON:  January 21, 2019 at 6:20 a.m. FINDINGS: Again identified are bilateral airspace opacities with some interval progression in the left mid lung zone. No pneumothorax. No large pleural effusion. The heart size is stable. There is no acute osseous abnormality. IMPRESSION: Slight interval progression of airspace disease in the left mid lung zone, otherwise no significant interval change. Electronically Signed   By: Constance Holster M.D.   On: 01/21/2019 19:33   Dg Chest Port 1 View  Result Date: 01/21/2019 CLINICAL DATA:  COVID-19 positive. Shortness of breath. EXAM: PORTABLE CHEST 1 VIEW COMPARISON:  01/14/2019 FINDINGS: Heterogeneous  bilateral lung opacities. Slight worsening opacities in the left upper lung zone from prior exam. Airspace disease is otherwise stable. Normal heart size with unchanged mediastinal contours. No pulmonary edema or pleural fluid. No acute osseous abnormality. IMPRESSION: Heterogeneous bilateral lung opacities consistent with COVID-19 pneumonia. Slight interval worsening in the left upper lung zone from radiograph 2 days ago. Airspace disease is otherwise stable.  Electronically Signed   By: Keith Rake M.D.   On: 01/21/2019 06:35   Dg Chest Port 1 View  Result Date: 01/07/2019 CLINICAL DATA:  Shortness breath, positive COVID-19 on Sunday EXAM: PORTABLE CHEST 1 VIEW COMPARISON:  None. FINDINGS: Combined interstitial and airspace opacities most pronounced throughout the right lung and in the left lung base. No pneumothorax. No effusion. The cardiomediastinal contours are unremarkable. No acute osseous or soft tissue abnormality. IMPRESSION: Appearance most concerning for a multifocal pneumonia in the setting of known COVID-19 positivity. Electronically Signed   By: Lovena Le M.D.   On: 01/20/2019 20:11

## 2019-01-23 NOTE — Progress Notes (Signed)
NAME:  Ashley Cook, MRN:  [id number], DOB:  11/02/1945, LOS: 4 ADMISSION DATE:  2019/01/27, CONSULTATION DATE:  11/22 REFERRING MD:  Ella Jubilee, CHIEF COMPLAINT:  COVID 19 pneumonia   Brief History   73 y/o admitted with COVID 19 pneumonia, treated with steroids, remdesivir, antibiotics and moved to the ICU on 11/21 for worsening hypoxemia.   Past Medical History  Asthma, COPD, hyperlipidemia  Significant Hospital Events   11/15 tested COVID positive 11/19 admit to PCU 11/21 transfer to ICU  Consults:  PCCM  Procedures:   Significant Diagnostic Tests:  Chest x-ray 11/21-bilateral airspace opacities with mild progression in the left midlung zone.  I reviewed the images personally.  Micro Data:  Blood cultures 11/19-negative  Antimicrobials/COVID Rx  Ceftriaxone 11/19>> 11/22 Azithromycin 11/19 >>11/22 Remdesivir 11/19 >>11/23 Decadron 11/19 >>  Unasyn 11/23 >   Interim history/subjective:  worsening hypoxemia Confusion   Objective   Blood pressure (!) 166/68, pulse 84, temperature 97.7 F (36.5 C), temperature source Axillary, resp. rate 19, height 5\' 3"  (1.6 m), weight 47.6 kg, SpO2 92 %.    FiO2 (%):  [80 %-100 %] 90 %   Intake/Output Summary (Last 24 hours) at 01/23/2019 1654 Last data filed at 01/23/2019 1600 Gross per 24 hour  Intake 903.5 ml  Output 2810 ml  Net -1906.5 ml   Filed Weights   01/27/19 1910  Weight: 47.6 kg    Examination:  General:  Resting comfortably in bed HENT: NCAT OP clear PULM: CTA B, normal effort CV: RRR, no mgr GI: BS+, soft, nontender MSK: normal bulk and tone Neuro: encephalopathic, somnolent, maew   Resolved Hospital Problem list     Assessment & Plan:  ARDS due to COVID 19 pneumonia: worsening hypoxemia Overall prognosis poor given COPD baseline and frail state Agree with DNR status Continue solumedrol Continue heated high flow titrated to maintain O2 saturation > 85% Complete remdesivir today  Best  practice:   Per [facility]  Labs   CBC: Recent Labs  Lab Jan 27, 2019 1938 01/20/19 0511 01/21/19 0915 01/21/19 1856 01/23/19 0818  WBC 9.0 10.3 15.1*  --   --   NEUTROABS 7.6 9.3* 12.9*  --   --   HGB 14.1 12.7 13.4 11.6* 12.2  HCT 41.9 38.3 40.3 34.0* 36.0  MCV 92.3 92.3 91.0  --   --   PLT 180 175 262  --   --     Basic Metabolic Panel: Recent Labs  Lab 2019-01-27 1938 01/20/19 0511 01/21/19 0915 01/21/19 1856 01/22/19 0110 01/23/19 0445 01/23/19 0818  NA 133* 139 142 144 144 149* 150*  K 4.0 4.7 4.5 4.1 4.2 5.0 3.8  CL 102 110 113*  --  117* 121*  --   CO2 17* 16* 16*  --  17* 14*  --   GLUCOSE 103* 168* 120*  --  142* 146*  --   BUN 8 7* 25*  --  31* 33*  --   CREATININE 0.92 0.83 0.76  --  0.78 0.75  --   CALCIUM 8.4* 7.8* 8.2*  --  8.4* 8.4*  --    GFR: Estimated Creatinine Clearance: 46.4 mL/min (by C-G formula based on SCr of 0.75 mg/dL). Recent Labs  Lab 2019/01/27 1938 January 27, 2019 1940 01-27-19 2134 27-Jan-2019 2344 01/20/19 0511 01/20/19 0929 01/21/19 0915 01/23/19 0500  PROCALCITON 1.21  --   --   --   --  0.62  --  0.24  WBC 9.0  --   --   --  10.3  --  15.1*  --   LATICACIDVEN  --  1.6 2.0* 1.5  --   --   --   --     Liver Function Tests: Recent Labs  Lab 01/15/2019 1938 01/20/19 0511 01/21/19 0915 01/22/19 0110 01/23/19 0445  AST 83* 66* 66* 61* 64*  ALT 63* 51* 48* 46* 46*  ALKPHOS 79 71 84 96 126  BILITOT 0.5 0.4 0.6 0.3 0.8  PROT 6.6 5.8* 6.2* 6.1* 6.3*  ALBUMIN 3.0* 2.5* 2.7* 2.7* 2.7*   No results for input(s): LIPASE, AMYLASE in the last 168 hours. No results for input(s): AMMONIA in the last 168 hours.  ABG    Component Value Date/Time   PHART 7.407 01/23/2019 0818   PCO2ART 25.6 (L) 01/23/2019 0818   PO2ART 44.0 (L) 01/23/2019 0818   HCO3 16.2 (L) 01/23/2019 0818   TCO2 17 (L) 01/23/2019 0818   ACIDBASEDEF 7.0 (H) 01/23/2019 0818   O2SAT 82.0 01/23/2019 0818     Coagulation Profile: No results for input(s): INR, PROTIME in the  last 168 hours.  Cardiac Enzymes: No results for input(s): CKTOTAL, CKMB, CKMBINDEX, TROPONINI in the last 168 hours.  HbA1C: No results found for: HGBA1C  CBG: Recent Labs  Lab 01/22/19 0251 01/23/19 0812  GLUCAP 126* 116*     Critical care time: 30 mintues      Roselie Awkward, MD [facility] Pager: [phone] Cell: [phone] If no response, call [phone]

## 2019-01-23 NOTE — Progress Notes (Signed)
Pharmacy Antibiotic Note  Ashley Cook is a 73 y.o. female admitted on 01/17/2019 with COVID-19 pneumonia.  Pharmacy has been consulted for Unasyn dosing for aspiration pneumonia. WBC 15.1 on 11/21. Afebrile. SCr wnl.   Plan: -Start Unasyn 3 gm IV Q 6 hours  -Monitor renal fx and clinical progress   Height: 5\' 3"  (160 cm) Weight: 105 lb (47.6 kg) IBW/kg (Calculated) : 52.4  Temp (24hrs), Avg:98 F (36.7 C), Min:97 F (36.1 C), Max:98.5 F (36.9 C)  Recent Labs  Lab 01/09/2019 1938 01/21/2019 1940 01/03/2019 2134 01/07/2019 2344 01/20/19 0511 01/21/19 0915 01/22/19 0110 01/23/19 0445  WBC 9.0  --   --   --  10.3 15.1*  --   --   CREATININE 0.92  --   --   --  0.83 0.76 0.78 0.75  LATICACIDVEN  --  1.6 2.0* 1.5  --   --   --   --     Estimated Creatinine Clearance: 46.4 mL/min (by C-G formula based on SCr of 0.75 mg/dL).    No Known Allergies    Thank you for allowing pharmacy to be a part of this patient's care.  Albertina Parr, PharmD., BCPS Clinical Pharmacist Clinical phone for 01/23/19 until 3:30pm: [phone]

## 2019-01-24 DIAGNOSIS — J9601 Acute respiratory failure with hypoxia: Secondary | ICD-10-CM

## 2019-01-24 DIAGNOSIS — J449 Chronic obstructive pulmonary disease, unspecified: Secondary | ICD-10-CM | POA: Diagnosis not present

## 2019-01-24 DIAGNOSIS — U071 COVID-19: Secondary | ICD-10-CM | POA: Diagnosis not present

## 2019-01-24 DIAGNOSIS — J1289 Other viral pneumonia: Secondary | ICD-10-CM | POA: Diagnosis not present

## 2019-01-24 LAB — CBC WITH DIFFERENTIAL/PLATELET
Abs Immature Granulocytes: 0.13 10*3/uL — ABNORMAL HIGH (ref 0.00–0.07)
Basophils Absolute: 0 10*3/uL (ref 0.0–0.1)
Basophils Relative: 0 %
Eosinophils Absolute: 0 10*3/uL (ref 0.0–0.5)
Eosinophils Relative: 0 %
HCT: 41.8 % (ref 36.0–46.0)
Hemoglobin: 13.8 g/dL (ref 12.0–15.0)
Immature Granulocytes: 1 %
Lymphocytes Relative: 4 %
Lymphs Abs: 0.7 10*3/uL (ref 0.7–4.0)
MCH: 29.7 pg (ref 26.0–34.0)
MCHC: 33 g/dL (ref 30.0–36.0)
MCV: 90.1 fL (ref 80.0–100.0)
Monocytes Absolute: 0.9 10*3/uL (ref 0.1–1.0)
Monocytes Relative: 5 %
Neutro Abs: 15.9 10*3/uL — ABNORMAL HIGH (ref 1.7–7.7)
Neutrophils Relative %: 90 %
Platelets: 352 10*3/uL (ref 150–400)
RBC: 4.64 MIL/uL (ref 3.87–5.11)
RDW: 15.3 % (ref 11.5–15.5)
WBC: 17.7 10*3/uL — ABNORMAL HIGH (ref 4.0–10.5)
nRBC: 0 % (ref 0.0–0.2)

## 2019-01-24 LAB — D-DIMER, QUANTITATIVE: D-Dimer, Quant: 1.81 ug/mL-FEU — ABNORMAL HIGH (ref 0.00–0.50)

## 2019-01-24 LAB — CULTURE, BLOOD (ROUTINE X 2)
Culture: NO GROWTH
Culture: NO GROWTH
Special Requests: ADEQUATE

## 2019-01-24 LAB — COMPREHENSIVE METABOLIC PANEL
ALT: 40 U/L (ref 0–44)
AST: 38 U/L (ref 15–41)
Albumin: 2.9 g/dL — ABNORMAL LOW (ref 3.5–5.0)
Alkaline Phosphatase: 123 U/L (ref 38–126)
Anion gap: 17 — ABNORMAL HIGH (ref 5–15)
BUN: 36 mg/dL — ABNORMAL HIGH (ref 8–23)
CO2: 18 mmol/L — ABNORMAL LOW (ref 22–32)
Calcium: 8.4 mg/dL — ABNORMAL LOW (ref 8.9–10.3)
Chloride: 115 mmol/L — ABNORMAL HIGH (ref 98–111)
Creatinine, Ser: 0.94 mg/dL (ref 0.44–1.00)
GFR calc Af Amer: >60 mL/min (ref >60)
GFR calc non Af Amer: 60 mL/min — ABNORMAL LOW (ref >60)
Glucose, Bld: 174 mg/dL — ABNORMAL HIGH (ref 70–99)
Potassium: 3.3 mmol/L — ABNORMAL LOW (ref 3.5–5.1)
Sodium: 150 mmol/L — ABNORMAL HIGH (ref 135–145)
Total Bilirubin: 0.9 mg/dL (ref 0.3–1.2)
Total Protein: 6.6 g/dL (ref 6.5–8.1)

## 2019-01-24 LAB — PROCALCITONIN: Procalcitonin: <0.1 ng/mL

## 2019-01-24 LAB — TSH: TSH: 0.243 u[IU]/mL — ABNORMAL LOW (ref 0.350–4.500)

## 2019-01-24 LAB — BRAIN NATRIURETIC PEPTIDE: B Natriuretic Peptide: 258.2 pg/mL — ABNORMAL HIGH (ref 0.0–100.0)

## 2019-01-24 LAB — C-REACTIVE PROTEIN: CRP: 2.8 mg/dL — ABNORMAL HIGH (ref <1.0)

## 2019-01-24 LAB — MAGNESIUM: Magnesium: 2.3 mg/dL (ref 1.7–2.4)

## 2019-01-24 MED ORDER — POTASSIUM CHLORIDE 10 MEQ/100ML IV SOLN
10.0000 meq | INTRAVENOUS | Status: AC
  Administered 2019-01-24: 10 meq via INTRAVENOUS
  Filled 2019-01-24 (×2): qty 100

## 2019-01-24 MED ORDER — LORAZEPAM 2 MG/ML IJ SOLN
0.5000 mg | Freq: Four times a day (QID) | INTRAMUSCULAR | Status: AC | PRN
  Administered 2019-01-29: 0.5 mg via INTRAVENOUS
  Filled 2019-01-24 (×3): qty 1

## 2019-01-24 MED ORDER — DEXTROSE 5 % IV SOLN
INTRAVENOUS | Status: AC
  Administered 2019-01-24 – 2019-01-25 (×2): via INTRAVENOUS

## 2019-01-24 MED ORDER — FENTANYL CITRATE (PF) 100 MCG/2ML IJ SOLN
12.5000 ug | INTRAMUSCULAR | Status: DC | PRN
  Administered 2019-01-26 – 2019-01-29 (×4): 12.5 ug via INTRAVENOUS
  Filled 2019-01-24 (×4): qty 2

## 2019-01-24 MED ORDER — CLONAZEPAM 0.5 MG PO TBDP
0.5000 mg | ORAL_TABLET | Freq: Two times a day (BID) | ORAL | Status: DC
  Administered 2019-01-24 – 2019-01-29 (×10): 0.5 mg via ORAL
  Filled 2019-01-24 (×11): qty 1

## 2019-01-24 MED ORDER — HYDRALAZINE HCL 20 MG/ML IJ SOLN
10.0000 mg | Freq: Four times a day (QID) | INTRAMUSCULAR | Status: DC | PRN
  Administered 2019-01-24 – 2019-01-29 (×3): 10 mg via INTRAVENOUS
  Filled 2019-01-24 (×3): qty 1

## 2019-01-24 MED ORDER — POTASSIUM CHLORIDE 10 MEQ/100ML IV SOLN
10.0000 meq | INTRAVENOUS | Status: AC
  Administered 2019-01-24 (×4): 10 meq via INTRAVENOUS
  Filled 2019-01-24 (×4): qty 100

## 2019-01-24 MED ORDER — DIGOXIN 0.25 MG/ML IJ SOLN
0.1250 mg | Freq: Four times a day (QID) | INTRAMUSCULAR | Status: AC
  Administered 2019-01-24 – 2019-01-25 (×2): 0.125 mg via INTRAVENOUS
  Filled 2019-01-24 (×2): qty 0.5

## 2019-01-24 MED ORDER — SODIUM CHLORIDE 0.9 % IV BOLUS
500.0000 mL | Freq: Once | INTRAVENOUS | Status: AC
  Administered 2019-01-24: 500 mL via INTRAVENOUS

## 2019-01-24 MED ORDER — DILTIAZEM HCL-DEXTROSE 125-5 MG/125ML-% IV SOLN (PREMIX)
5.0000 mg/h | INTRAVENOUS | Status: DC
  Administered 2019-01-24: 17:00:00 5 mg/h via INTRAVENOUS
  Administered 2019-01-24 – 2019-01-25 (×3): 15 mg/h via INTRAVENOUS
  Administered 2019-01-26: 12.5 mg/h via INTRAVENOUS
  Administered 2019-01-26: 15 mg/h via INTRAVENOUS
  Filled 2019-01-24 (×8): qty 125

## 2019-01-24 MED ORDER — FUROSEMIDE 10 MG/ML IJ SOLN
40.0000 mg | Freq: Once | INTRAMUSCULAR | Status: AC
  Administered 2019-01-24: 40 mg via INTRAVENOUS
  Filled 2019-01-24: qty 4

## 2019-01-24 MED ORDER — MORPHINE SULFATE (PF) 2 MG/ML IV SOLN
1.0000 mg | INTRAVENOUS | Status: DC | PRN
  Administered 2019-01-24: 1 mg via INTRAVENOUS
  Administered 2019-01-25 – 2019-01-26 (×4): 2 mg via INTRAVENOUS
  Filled 2019-01-24 (×6): qty 1

## 2019-01-24 MED ORDER — AMLODIPINE BESYLATE 5 MG PO TABS: 10.0000 mg | ORAL_TABLET | Freq: Every day | ORAL | Status: DC

## 2019-01-24 MED ORDER — DILTIAZEM LOAD VIA INFUSION
10.0000 mg | Freq: Once | INTRAVENOUS | Status: AC
  Administered 2019-01-24: 10 mg via INTRAVENOUS
  Filled 2019-01-24: qty 10

## 2019-01-24 MED ORDER — DILTIAZEM HCL 25 MG/5ML IV SOLN: 10.0000 mg | Freq: Four times a day (QID) | INTRAVENOUS | Status: DC | PRN

## 2019-01-24 NOTE — Progress Notes (Signed)
Assisted tele visit to patient with son.  Ashley Glock M Davonn Flanery, RN   

## 2019-01-24 NOTE — Progress Notes (Signed)
MD Candiss Norse stated to give another 10mg  bolus of Cardizem, bolus started at this time.

## 2019-01-24 NOTE — Plan of Care (Signed)
Patient Summary: No adverse events throughout the night. No SO or acute distress noted. Increased HFN FiO2 to 100% re: patient sats <84%. Patient disoriented to time, place, situation. Plan of care explained to patient. No evidence of learning. Reinforcement needed. Patient remained on HFNC throughout the night. See MAR re: medications administered. See flowsheet re: assessment. Bed in low locked position.  Problem: Education: Goal: Knowledge of risk factors and measures for prevention of condition will improve Outcome: Progressing   Problem: Coping: Goal: Psychosocial and spiritual needs will be supported Outcome: Progressing   Problem: Respiratory: Goal: Will maintain a patent airway Outcome: Progressing Goal: Complications related to the disease process, condition or treatment will be avoided or minimized Outcome: Progressing   Problem: Education: Goal: Knowledge of General Education information will improve Description: Including pain rating scale, medication(s)/side effects and non-pharmacologic comfort measures Outcome: Progressing   Problem: Health Behavior/Discharge Planning: Goal: Ability to manage health-related needs will improve Outcome: Progressing   Problem: Clinical Measurements: Goal: Ability to maintain clinical measurements within normal limits will improve Outcome: Progressing Goal: Will remain free from infection Outcome: Progressing Goal: Diagnostic test results will improve Outcome: Progressing Goal: Respiratory complications will improve Outcome: Progressing Goal: Cardiovascular complication will be avoided Outcome: Progressing   Problem: Activity: Goal: Risk for activity intolerance will decrease Outcome: Progressing   Problem: Nutrition: Goal: Adequate nutrition will be maintained Outcome: Progressing   Problem: Coping: Goal: Level of anxiety will decrease Outcome: Progressing   Problem: Elimination: Goal: Will not experience complications  related to bowel motility Outcome: Progressing Goal: Will not experience complications related to urinary retention Outcome: Progressing   Problem: Pain Managment: Goal: General experience of comfort will improve Outcome: Progressing   Problem: Safety: Goal: Ability to remain free from injury will improve Outcome: Progressing   Problem: Skin Integrity: Goal: Risk for impaired skin integrity will decrease Outcome: Progressing

## 2019-01-24 NOTE — Plan of Care (Deleted)
Patient Summary: Remained on the ventilator throughout the night. Patient proned on 11/23 at 2200. All lines and tubes remained intact. Patient remains on fentanyl and versed. See MAR re: medication titration. No pressors needed at this time for BP support. MAP >65. 8546 noted 5beat run of Vtach. Patient FiO2 90%. Required vecuronium pushes x2doses. See MAR re: medications administered. See flowsheet re: assessment. Patient proned in reverse trendelenburg. TF, Vital HP,  continued at 13ml/hr.Bed locked. Plan of care continued.   Problem: Education: Goal: Knowledge of risk factors and measures for prevention of condition will improve 01/24/2019 0654 by Haskel Schroeder, RN Outcome: Progressing 01/24/2019 0606 by Haskel Schroeder, RN Outcome: Progressing   Problem: Coping: Goal: Psychosocial and spiritual needs will be supported 01/24/2019 0654 by Haskel Schroeder, RN Outcome: Progressing 01/24/2019 0606 by Haskel Schroeder, RN Outcome: Progressing   Problem: Respiratory: Goal: Will maintain a patent airway 01/24/2019 0654 by Haskel Schroeder, RN Outcome: Progressing 01/24/2019 0606 by Haskel Schroeder, RN Outcome: Progressing Goal: Complications related to the disease process, condition or treatment will be avoided or minimized 01/24/2019 0654 by Haskel Schroeder, RN Outcome: Progressing 01/24/2019 0606 by Haskel Schroeder, RN Outcome: Progressing   Problem: Education: Goal: Knowledge of General Education information will improve Description: Including pain rating scale, medication(s)/side effects and non-pharmacologic comfort measures 01/24/2019 0654 by Haskel Schroeder, RN Outcome: Progressing 01/24/2019 0606 by Haskel Schroeder, RN Outcome: Progressing   Problem: Health Behavior/Discharge Planning: Goal: Ability to manage health-related needs will improve 01/24/2019 0654 by Haskel Schroeder, RN Outcome: Progressing 01/24/2019 0606 by Haskel Schroeder, RN Outcome:  Progressing   Problem: Clinical Measurements: Goal: Ability to maintain clinical measurements within normal limits will improve 01/24/2019 0654 by Haskel Schroeder, RN Outcome: Progressing 01/24/2019 0606 by Haskel Schroeder, RN Outcome: Progressing Goal: Will remain free from infection 01/24/2019 0654 by Haskel Schroeder, RN Outcome: Progressing 01/24/2019 0606 by Haskel Schroeder, RN Outcome: Progressing Goal: Diagnostic test results will improve 01/24/2019 0654 by Haskel Schroeder, RN Outcome: Progressing 01/24/2019 0606 by Haskel Schroeder, RN Outcome: Progressing Goal: Respiratory complications will improve 01/24/2019 0654 by Haskel Schroeder, RN Outcome: Progressing 01/24/2019 0606 by Haskel Schroeder, RN Outcome: Progressing Goal: Cardiovascular complication will be avoided 01/24/2019 0654 by Haskel Schroeder, RN Outcome: Progressing 01/24/2019 0606 by Haskel Schroeder, RN Outcome: Progressing   Problem: Activity: Goal: Risk for activity intolerance will decrease 01/24/2019 0654 by Haskel Schroeder, RN Outcome: Progressing 01/24/2019 0606 by Haskel Schroeder, RN Outcome: Progressing   Problem: Nutrition: Goal: Adequate nutrition will be maintained 01/24/2019 0654 by Haskel Schroeder, RN Outcome: Progressing 01/24/2019 0606 by Haskel Schroeder, RN Outcome: Progressing   Problem: Coping: Goal: Level of anxiety will decrease 01/24/2019 0654 by Haskel Schroeder, RN Outcome: Progressing 01/24/2019 0606 by Haskel Schroeder, RN Outcome: Progressing   Problem: Elimination: Goal: Will not experience complications related to bowel motility 01/24/2019 0654 by Haskel Schroeder, RN Outcome: Progressing 01/24/2019 0606 by Haskel Schroeder, RN Outcome: Progressing Goal: Will not experience complications related to urinary retention 01/24/2019 0654 by Haskel Schroeder, RN Outcome: Progressing 01/24/2019 0606 by Haskel Schroeder, RN Outcome: Progressing   Problem:  Pain Managment: Goal: General experience of comfort will improve 01/24/2019 0654 by Haskel Schroeder, RN Outcome: Progressing 01/24/2019 0606 by Haskel Schroeder, RN Outcome: Progressing   Problem: Safety: Goal: Ability to remain free from injury will improve 01/24/2019 0654  by Arlyn Dunning, RN Outcome: Progressing 01/24/2019 0606 by Arlyn Dunning, RN Outcome: Progressing   Problem: Skin Integrity: Goal: Risk for impaired skin integrity will decrease 01/24/2019 0654 by Arlyn Dunning, RN Outcome: Progressing 01/24/2019 0606 by Arlyn Dunning, RN Outcome: Progressing

## 2019-01-24 NOTE — Progress Notes (Signed)
Notifed MD of EKG.  MD placed orders, see [facility]

## 2019-01-24 NOTE — Progress Notes (Signed)
NAME:  Ashley Cook, MRN:  [id number], DOB:  1945/08/02, LOS: 5 ADMISSION DATE:  02-17-19, CONSULTATION DATE:  11/22 REFERRING MD:  Cathlean Sauer, CHIEF COMPLAINT:  COVID 19 pneumonia   Brief History   73 y/o admitted with COVID 19 pneumonia, treated with steroids, remdesivir, antibiotics and moved to the ICU on 11/21 for worsening hypoxemia.  Past Medical History  Asthma, COPD, hyperlipidemia  Significant Hospital Events   11/15 tested COVID positive 11/19 admit to PCU 11/21 transfer to ICU  Consults:  PCCM  Procedures:   Significant Diagnostic Tests:  Chest x-ray 11/21-bilateral airspace opacities with mild progression in the left midlung zone.  I reviewed the images personally.  Micro Data:  Blood cultures 11/19-negative  Antimicrobials/COVID Rx  Ceftriaxone 11/19>> 11/22 Azithromycin 11/19 >>11/22 Remdesivir 11/19 >>11/23 Decadron 11/19 >>  Unasyn 11/23 >   Interim history/subjective:   Remains encephalopathic Hypoxemia unchanged  Objective   Blood pressure (!) 173/68, pulse (!) 117, temperature (!) 97 F (36.1 C), temperature source Axillary, resp. rate (!) 36, height 5\' 3"  (1.6 m), weight 47.6 kg, SpO2 (!) 88 %.    FiO2 (%):  [80 %-100 %] 100 %   Intake/Output Summary (Last 24 hours) at 01/24/2019 1018 Last data filed at 01/24/2019 0900 Gross per 24 hour  Intake 1112.55 ml  Output 4313 ml  Net -3200.45 ml   Filed Weights   2019-02-17 1910  Weight: 47.6 kg    Examination:  General:  Resting comfortably in bed HENT: NCAT OP clear PULM: Crackles bases B, normal effort CV: RRR, no mgr GI: BS+, soft, nontender MSK: normal bulk and tone Neuro: sleepy, will respond to pain but doesn't follow commands   Resolved Hospital Problem list     Assessment & Plan:  ARDS due to COVID 19 pneumonia: severe hypoxemia Overall prognosis poor given COPD and frail baseline health status Agree with DNR status Continue solumedrol Continue heated high flow  titrated to maintain O2 saturation > 85% Completed remdesivir  Low threshold to move to full comfort measures if condition worsens, shows signs of respiratory distress  Best practice:   Per [facility]  Labs   CBC: Recent Labs  Lab 2019-02-17 1938 01/20/19 0511 01/21/19 0915 01/21/19 1856 01/23/19 0818 01/24/19 0530  WBC 9.0 10.3 15.1*  --   --  17.7*  NEUTROABS 7.6 9.3* 12.9*  --   --  15.9*  HGB 14.1 12.7 13.4 11.6* 12.2 13.8  HCT 41.9 38.3 40.3 34.0* 36.0 41.8  MCV 92.3 92.3 91.0  --   --  90.1  PLT 180 175 262  --   --  539    Basic Metabolic Panel: Recent Labs  Lab 01/20/19 0511 01/21/19 0915 01/21/19 1856 01/22/19 0110 01/23/19 0445 01/23/19 0818 01/24/19 0530  NA 139 142 144 144 149* 150* 150*  K 4.7 4.5 4.1 4.2 5.0 3.8 3.3*  CL 110 113*  --  117* 121*  --  115*  CO2 16* 16*  --  17* 14*  --  18*  GLUCOSE 168* 120*  --  142* 146*  --  174*  BUN 7* 25*  --  31* 33*  --  36*  CREATININE 0.83 0.76  --  0.78 0.75  --  0.94  CALCIUM 7.8* 8.2*  --  8.4* 8.4*  --  8.4*  MG  --   --   --   --   --   --  2.3   GFR: Estimated Creatinine Clearance: 39.5 mL/min (by C-G  formula based on SCr of 0.94 mg/dL). Recent Labs  Lab 02-07-2019 1938 02/07/19 1940 02/07/19 2134 Feb 07, 2019 2344 01/20/19 0511 01/20/19 0929 01/21/19 0915 01/23/19 0500 01/24/19 0530  PROCALCITON 1.21  --   --   --   --  0.62  --  0.24 <0.10  WBC 9.0  --   --   --  10.3  --  15.1*  --  17.7*  LATICACIDVEN  --  1.6 2.0* 1.5  --   --   --   --   --     Liver Function Tests: Recent Labs  Lab 01/20/19 0511 01/21/19 0915 01/22/19 0110 01/23/19 0445 01/24/19 0530  AST 66* 66* 61* 64* 38  ALT 51* 48* 46* 46* 40  ALKPHOS 71 84 96 126 123  BILITOT 0.4 0.6 0.3 0.8 0.9  PROT 5.8* 6.2* 6.1* 6.3* 6.6  ALBUMIN 2.5* 2.7* 2.7* 2.7* 2.9*   No results for input(s): LIPASE, AMYLASE in the last 168 hours. No results for input(s): AMMONIA in the last 168 hours.  ABG    Component Value Date/Time   PHART  7.407 01/23/2019 0818   PCO2ART 25.6 (L) 01/23/2019 0818   PO2ART 44.0 (L) 01/23/2019 0818   HCO3 16.2 (L) 01/23/2019 0818   TCO2 17 (L) 01/23/2019 0818   ACIDBASEDEF 7.0 (H) 01/23/2019 0818   O2SAT 82.0 01/23/2019 0818     Coagulation Profile: No results for input(s): INR, PROTIME in the last 168 hours.  Cardiac Enzymes: No results for input(s): CKTOTAL, CKMB, CKMBINDEX, TROPONINI in the last 168 hours.  HbA1C: No results found for: HGBA1C  CBG: Recent Labs  Lab 01/22/19 0251 01/23/19 0812  GLUCAP 126* 116*     Critical care time: n/a      Heber Lookout, MD [place] [facility] Pager: [phone] Cell: [phone] If no response, call [phone]

## 2019-01-24 NOTE — Progress Notes (Signed)
SLP Cancellation Note  Patient Details Name: Ashley Cook MRN: [id number] DOB: [birth year]   Cancelled treatment:       Reason Eval/Treat Not Completed: Fatigue/lethargy limiting ability to participate. Pt will open eyes, but will not accept PO, turns head away, seals lips or bites. Will attempt when mentation has improved. Given weak congested cough would recommend ongoing NPO status.    Ashley Cook, Ashley Cook 01/24/2019, 1:53 PM

## 2019-01-24 NOTE — Progress Notes (Signed)
MD stated 'No further escalation of care than what we are doing right now' in chat since he has spoken with the family.  Inform MD if pt becomes worse overnight, no further orders at this time.

## 2019-01-24 NOTE — Progress Notes (Signed)
Notified MD bolus completed with no change in HR, MD stated to give 1mg  IVP Morphine and see MAR for other orders.

## 2019-01-24 NOTE — Progress Notes (Signed)
Spoke with MD Candiss Norse on phone, noted pt to have Afib with RVR 140-200, BP stable, no other changes.  Md placed orders, will complete soon.

## 2019-01-24 NOTE — Progress Notes (Addendum)
PROGRESS NOTE                                                                                                                                                                                                             Patient Demographics:    Ashley Cook, is a 73 y.o. female, DOB - 1945/08/12, NFA:[id number]  Outpatient Primary MD for the patient is System, Pcp Not In    LOS - 5  Admit date - 01/27/2019    CC - SOB     Brief Narrative  73 year old female who presented with dyspnea and cough. She does have significant past medical history of asthma, and COPD. She tested positive for COVID-19 November 15, she has been symptomatic with dyspnea, cough, fatigue and body aches, she had the symptoms for 3 to 4 days and when she became extremely short of breath she came to the ER where she was diagnosed with acute hypoxic respiratory failure secondary to COVID-19 pneumonia she was placed on 4 L oxygen and was started on appropriate treatment and sent to Ashley Cook.   Subjective:    Ashley Cook today in bed on heated high flow appears lethargic with coarse audible breath sounds.  Minimally responsive.   Assessment  & Plan :     1. Acute Hypoxic Resp. Failure due to Acute Covid 19 Viral Pneumonitis during the ongoing 2020 Covid 19 Pandemic - she has severe disease and unfortunately she waited at home for several days with shortness of breath before presenting to the hospital.  She was seen by my partners and given appropriate treatment which includes remdesivir, convalescent plasma and Actemra.  Unfortunately it looks like her disease had already progressed quite a bit prior to presentation.  She is currently requiring 30 L of high flow nasal cannula oxygen with extremely poor oxygen saturation, she is appearing more and more lethargic with coarse bilateral breath sounds.  I also suspect that she is microaspirating at this point.   Her prognosis appears extremely grim.  I discussed her case with her family in detail plan is to continue aggressive medical treatment however if she declines despite doing this we will focus on keeping her comfortable.    She will be DNR as I think intubating her will harm her more than benefit her in the long run.  I discussed this with  pulmonary physician Dr. Lake Cook as well.  Will use I-S and flutter valve as tolerated for pulmonary toiletry.   SpO2: (!) 88 % O2 Flow Rate (L/min): 25 L/min FiO2 (%): 100 %  Hepatic Function Latest Ref Rng & Units 01/24/2019 01/23/2019 01/22/2019  Total Protein 6.5 - 8.1 g/dL 6.6 6.3(L) 6.1(L)  Albumin 3.5 - 5.0 g/dL 2.9(L) 2.7(L) 2.7(L)  AST 15 - 41 U/L 38 64(H) 61(H)  ALT 0 - 44 U/L 40 46(H) 46(H)  Alk Phosphatase 38 - 126 U/L 123 126 96  Total Bilirubin 0.3 - 1.2 mg/dL 0.9 0.8 0.3    FiO2 (%):  [80 %-100 %] 100 %  ABG     Component Value Date/Time   PHART 7.407 01/23/2019 0818   PCO2ART 25.6 (L) 01/23/2019 0818   PO2ART 44.0 (L) 01/23/2019 0818   HCO3 16.2 (L) 01/23/2019 0818   TCO2 17 (L) 01/23/2019 0818   ACIDBASEDEF 7.0 (H) 01/23/2019 0818   O2SAT 82.0 01/23/2019 0818    COVID-19 Labs  Recent Labs    01/22/19 0110 01/23/19 0445 01/24/19 0530  DDIMER 2.04* 1.95* 1.81*  FERRITIN 640* 562*  --   CRP 7.5* 5.1* 2.8*    Lab Results  Component Value Date   SARSCOV2NAA POSITIVE (A) 01/14/2019   [place] Not Detected 01/11/2019        Component Value Date/Time   BNP 258.2 (H) 01/24/2019 0530     2.  Elevated proBNP.  Does appear to be intravascularly dehydrated with third spaced fluid, repeat Lasix IV and monitor.  No echo on file.  3.  Toxic and metabolic encephalopathy.  N.p.o. except medications for now.  4.  Concern for ongoing microaspiration due to encephalopathy.  Unasyn.  N.p.o. except meds.  Trend procalcitonin.  Results for Ashley Cook (MRN [id number]) as of 01/24/2019 09:42  Ref. Range 01/16/2019  19:38 01/20/2019 09:29 01/23/2019 05:00 01/24/2019 05:30  Procalcitonin Latest Units: ng/mL 1.21 0.62 0.24 <0.10    5.  Underlying COPD.  Supportive care.  6.  Dyslipidemia.  Statin once she is able to take p.o.  7.  Hypernatremia.  Likely due to intravascular dehydration, she has evidence of third spacing and proBNP is elevated along with rails on exam.  D5W gentle unfortunately she will have to get Lasix at this time due to her respiratory compromise.  Will hydrate as we are able to.   8. Hypokalemia - replaced.  9.  Late in the day around 4 PM patient went into new onset A. fib with RVR.  Started her on IV Cardizem drip, check TSH, 2 doses of IV digoxin as rate was still high after an hour of IV Cardizem, gentle IV fluid bolus.  She is a poor candidate for full dose anticoagulation at this time, if she clinically does not turn around probably transitioning to comfort care by tomorrow.  DW family again at 5.50 pm.   Condition - Extremely Guarded  Family Communication  : Discussed with husband, daughter and son over the phone on 01/23/2019 in detail.  Plan is continue present supportive medical treatment with steroids, Lasix and oxygen.  DNR.  If she declines then focus on comfort.  Discussed with family again at 5:50 PM.  No further escalation of care than what we are doing right now.  If at any point patient gets worse or is suffering we will transition to morphine drip.    Code Status : DNR  Diet : N.p.o. except medications will have speech evaluate.  Disposition  Plan  : ICU  Consults  :  None  Procedures  :     PUD Prophylaxis : Protonix IV.  DVT Prophylaxis  :  Lovenox   Lab Results  Component Value Date   PLT 352 01/24/2019    Inpatient Medications  Scheduled Meds:  amLODipine  10 mg Oral Daily   Chlorhexidine Gluconate Cloth  6 each Topical Daily   clonazepam  0.5 mg Oral BID   cloNIDine  0.2 mg Transdermal Q Mon   enoxaparin (LOVENOX) injection  40 mg  Subcutaneous BID   fluticasone furoate-vilanterol  1 puff Inhalation Daily   magic mouthwash w/lidocaine  5 mL Oral QID   mouth rinse  15 mL Mouth Rinse BID   methylPREDNISolone (SOLU-MEDROL) injection  60 mg Intravenous Q12H   nystatin  5 mL Oral QID   pantoprazole (PROTONIX) IV  40 mg Intravenous Q24H   sodium polystyrene  15 g Oral Once   vitamin C  500 mg Oral Daily   zinc sulfate  220 mg Oral Daily   Continuous Infusions:  ampicillin-sulbactam (UNASYN) IV Stopped (01/24/19 4536)   dextrose Stopped (01/24/19 0752)   potassium chloride 10 mEq (01/24/19 0853)   PRN Meds:.acetaminophen, albuterol, fentaNYL (SUBLIMAZE) injection, hydrALAZINE, Ipratropium-Albuterol  Antibiotics  :    Anti-infectives (From admission, onward)   Start     Dose/Rate Route Frequency Ordered Stop   01/23/19 1100  Ampicillin-Sulbactam (UNASYN) 3 g in sodium chloride 0.9 % 100 mL IVPB     3 g 200 mL/hr over 30 Minutes Intravenous Every 6 hours 01/23/19 1044     01/21/19 1000  remdesivir 100 mg in sodium chloride 0.9 % 250 mL IVPB  Status:  Discontinued     100 mg 500 mL/hr over 30 Minutes Intravenous Every 24 hours 01/20/19 0008 01/20/19 1015   01/21/19 1000  azithromycin (ZITHROMAX) tablet 500 mg  Status:  Discontinued     500 mg Oral Daily 01/21/19 0925 01/23/19 1028   01/20/19 1800  fluconazole (DIFLUCAN) tablet 150 mg     150 mg Oral  Once 01/20/19 1631 01/20/19 1828   01/20/19 1600  remdesivir 100 mg in sodium chloride 0.9 % 250 mL IVPB     100 mg 500 mL/hr over 30 Minutes Intravenous Daily 01/20/19 1015 01/23/19 1201   01/20/19 0015  remdesivir 200 mg in sodium chloride 0.9 % 250 mL IVPB     200 mg 500 mL/hr over 30 Minutes Intravenous Once 01/20/19 0008 01/20/19 0141   01/01/2019 2045  cefTRIAXone (ROCEPHIN) 2 g in sodium chloride 0.9 % 100 mL IVPB  Status:  Discontinued     2 g 200 mL/hr over 30 Minutes Intravenous Every 24 hours 01/20/2019 2032 01/23/19 1028   01/17/2019 2045   azithromycin (ZITHROMAX) 500 mg in sodium chloride 0.9 % 250 mL IVPB  Status:  Discontinued     500 mg 250 mL/hr over 60 Minutes Intravenous Every 24 hours 01/15/2019 2032 01/21/19 0925       Time Spent in minutes  30   Lala Lund M.D on 01/24/2019 at 9:39 AM  To page go to [email or web address] - password [facility]  [facility] -  Office  [phone]  See all Orders from today for further details    Objective:   Vitals:   01/24/19 0807 01/24/19 0809 01/24/19 0830 01/24/19 0900  BP: (!) 176/82 (!) 176/82 (!) 174/89 (!) 184/102  Pulse: (!) 118 (!) 111 100 (!) 117  Resp: (!) 24  (!) 28 (!)  36  Temp:      TempSrc:      SpO2: (!) 83%  (!) 87% (!) 88%  Weight:      Height:        Wt Readings from Last 3 Encounters:  01/08/2019 47.6 kg     Intake/Output Summary (Last 24 hours) at 01/24/2019 0939 Last data filed at 01/24/2019 0900 Gross per 24 hour  Intake 1322.24 ml  Output 4313 ml  Net -2990.76 ml     Physical Exam  Awake but lethargic , No new F.N deficits, Normal affect [place].AT,PERRAL Supple Neck,No JVD, No cervical lymphadenopathy appriciated.  Symmetrical Chest wall movement, Good air movement bilaterally, Coarse B sounds RRR,No Gallops, Rubs or new Murmurs, No Parasternal Heave +ve B.Sounds, Abd Soft, No tenderness, No organomegaly appriciated, No rebound - guarding or rigidity. No Cyanosis, Clubbing or edema, No new Rash or bruise    Data Review:    CBC Recent Labs  Lab 01/10/2019 1938 01/20/19 0511 01/21/19 0915 01/21/19 1856 01/23/19 0818 01/24/19 0530  WBC 9.0 10.3 15.1*  --   --  17.7*  HGB 14.1 12.7 13.4 11.6* 12.2 13.8  HCT 41.9 38.3 40.3 34.0* 36.0 41.8  PLT 180 175 262  --   --  352  MCV 92.3 92.3 91.0  --   --  90.1  MCH 31.1 30.6 30.2  --   --  29.7  MCHC 33.7 33.2 33.3  --   --  33.0  RDW 14.6 14.7 15.1  --   --  15.3  LYMPHSABS 1.0 0.7 1.3  --   --  0.7  MONOABS 0.3 0.2 0.7  --   --  0.9  EOSABS 0.0 0.0 0.0  --   --  0.0  BASOSABS  0.0 0.0 0.0  --   --  0.0    Chemistries  Recent Labs  Lab 01/20/19 0511 01/21/19 0915 01/21/19 1856 01/22/19 0110 01/23/19 0445 01/23/19 0818 01/24/19 0530  NA 139 142 144 144 149* 150* 150*  K 4.7 4.5 4.1 4.2 5.0 3.8 3.3*  CL 110 113*  --  117* 121*  --  115*  CO2 16* 16*  --  17* 14*  --  18*  GLUCOSE 168* 120*  --  142* 146*  --  174*  BUN 7* 25*  --  31* 33*  --  36*  CREATININE 0.83 0.76  --  0.78 0.75  --  0.94  CALCIUM 7.8* 8.2*  --  8.4* 8.4*  --  8.4*  MG  --   --   --   --   --   --  2.3  AST 66* 66*  --  61* 64*  --  38  ALT 51* 48*  --  46* 46*  --  40  ALKPHOS 71 84  --  96 126  --  123  BILITOT 0.4 0.6  --  0.3 0.8  --  0.9   ------------------------------------------------------------------------------------------------------------------ No results for input(s): CHOL, HDL, LDLCALC, TRIG, CHOLHDL, LDLDIRECT in the last 72 hours.  No results found for: HGBA1C ------------------------------------------------------------------------------------------------------------------ No results for input(s): TSH, T4TOTAL, T3FREE, THYROIDAB in the last 72 hours.  Invalid input(s): FREET3  Cardiac Enzymes No results for input(s): CKMB, TROPONINI, MYOGLOBIN in the last 168 hours.  Invalid input(s): CK ------------------------------------------------------------------------------------------------------------------    Component Value Date/Time   BNP 258.2 (H) 01/24/2019 0530    Micro Results Recent Results (from the past 240 hour(s))  Blood Culture (routine x 2)  Status: None (Preliminary result)   Collection Time: 01/10/2019  7:34 PM   Specimen: BLOOD  Result Value Ref Range Status   Specimen Description   Final    BLOOD LEFT ANTECUBITAL Performed at [facility] [address] [place], [place] [address]    Special Requests   Final    BOTTLES DRAWN AEROBIC AND ANAEROBIC Blood Culture adequate volume Performed at [facility] [address] [place], [place] [address]    Culture   Final    NO GROWTH 4 DAYS Performed at [facility], [place] [address] [place], [place] [address]    Report Status PENDING  Incomplete  Blood Culture (routine x 2)     Status: None (Preliminary result)   Collection Time: 01/16/2019  7:34 PM   Specimen: BLOOD LEFT FOREARM  Result Value Ref Range Status   Specimen Description   Final    BLOOD LEFT FOREARM Performed at [facility], [place] [address] [place], [place] [address]    Special Requests   Final    BOTTLES DRAWN AEROBIC AND ANAEROBIC Blood Culture results may not be optimal due to an inadequate volume of blood received in culture bottles Performed at [facility] [address], [place], [place] [address]    Culture   Final    NO GROWTH 4 DAYS Performed at [facility], [place] [address], [place], [place] [address]    Report Status PENDING  Incomplete  SARS CORONAVIRUS 2 (TAT 6-24 HRS) Nasopharyngeal Nasopharyngeal Swab     Status: Abnormal   Collection Time: 01/22/2019 11:44 PM   Specimen: Nasopharyngeal Swab  Result Value Ref Range Status   SARS Coronavirus 2 POSITIVE (A) NEGATIVE Final    Comment: RESULT CALLED TO, READ BACK BY AND VERIFIED WITH: Marisa Hua RN 9:40 01/20/19 ([email or web address]) (NOTE) SARS-CoV-2 target nucleic acids are DETECTED. The SARS-CoV-2 RNA is generally detectable in upper and lower respiratory specimens during the acute phase of infection. Positive results are indicative of active infection with SARS-CoV-2. Clinical  correlation with patient history and other diagnostic information is necessary to determine patient infection status. Positive results do  not rule out bacterial infection or co-infection with other viruses. The expected result is Negative. Fact Sheet for Patients: SugarRoll.be Fact Sheet for Healthcare Providers: https://www.woods-mathews.com/ This test  is not yet approved or cleared by the [place] FDA and  has been authorized for detection and/or diagnosis of SARS-CoV-2 by FDA under an Emergency Use Authorization (EUA). This EUA will remain  in effect (meaning this test can be used) for  the duration of the COVID-19 declaration under Section 564(b)(1) of the Act, 21 U.S.C. section 360bbb-3(b)(1), unless the authorization is terminated or revoked sooner. Performed at [facility], [place] [address], [place],  [address]   MRSA PCR Screening     Status: None   Collection Time: 01/22/19  2:57 AM   Specimen: Nasal Mucosa; Nasopharyngeal  Result Value Ref Range Status   MRSA by PCR NEGATIVE NEGATIVE Final    Comment:        The GeneXpert MRSA Assay (FDA approved for NASAL specimens only), is one component of a comprehensive MRSA colonization surveillance program. It is not intended to diagnose MRSA infection nor to guide or monitor treatment for MRSA infections. Performed at [facility], [place] [address], [place],  [address]     Radiology Reports Dg Chest 1 View  Result Date:  01/23/2019 CLINICAL DATA:  73 year old female with history of oxygen desaturation. EXAM: CHEST  1 VIEW COMPARISON:  Chest x-ray 01/21/2019. FINDINGS: Hazy ill-defined opacities and widespread areas of interstitial prominence are noted throughout the lungs bilaterally, asymmetrically distributed, most severe throughout the left mid to lower lung. This distribution has shifted over the past several days, likely to reflect evolving multilobar atypical pneumonia. No pleural effusions. No evidence of pulmonary edema. Heart size is normal. Upper mediastinal contours are within normal limits. Aortic atherosclerosis. IMPRESSION: 1. Multilobar bilateral pneumonia with similar aeration compared to the recent prior study from January 21, 2019, as above. 2. Aortic atherosclerosis. Electronically Signed   By: Vinnie Langton M.D.   On:  01/23/2019 09:16   Dg Chest 1 View  Result Date: 01/21/2019 CLINICAL DATA:  Pneumonia EXAM: CHEST  1 VIEW COMPARISON:  January 21, 2019 at 6:20 a.m. FINDINGS: Again identified are bilateral airspace opacities with some interval progression in the left mid lung zone. No pneumothorax. No large pleural effusion. The heart size is stable. There is no acute osseous abnormality. IMPRESSION: Slight interval progression of airspace disease in the left mid lung zone, otherwise no significant interval change. Electronically Signed   By: Constance Holster M.D.   On: 01/21/2019 19:33   Dg Chest Port 1 View  Result Date: 01/21/2019 CLINICAL DATA:  COVID-19 positive. Shortness of breath. EXAM: PORTABLE CHEST 1 VIEW COMPARISON:  01/26/2019 FINDINGS: Heterogeneous bilateral lung opacities. Slight worsening opacities in the left upper lung zone from prior exam. Airspace disease is otherwise stable. Normal heart size with unchanged mediastinal contours. No pulmonary edema or pleural fluid. No acute osseous abnormality. IMPRESSION: Heterogeneous bilateral lung opacities consistent with COVID-19 pneumonia. Slight interval worsening in the left upper lung zone from radiograph 2 days ago. Airspace disease is otherwise stable. Electronically Signed   By: Keith Rake M.D.   On: 01/21/2019 06:35   Dg Chest Port 1 View  Result Date: 01/04/2019 CLINICAL DATA:  Shortness breath, positive COVID-19 on Sunday EXAM: PORTABLE CHEST 1 VIEW COMPARISON:  None. FINDINGS: Combined interstitial and airspace opacities most pronounced throughout the right lung and in the left lung base. No pneumothorax. No effusion. The cardiomediastinal contours are unremarkable. No acute osseous or soft tissue abnormality. IMPRESSION: Appearance most concerning for a multifocal pneumonia in the setting of known COVID-19 positivity. Electronically Signed   By: Lovena Le M.D.   On: 01/20/2019 20:11

## 2019-01-24 NOTE — Plan of Care (Signed)
Pt drowsy and orientated x1, will update family of POC.

## 2019-01-25 DIAGNOSIS — J431 Panlobular emphysema: Secondary | ICD-10-CM

## 2019-01-25 DIAGNOSIS — E7800 Pure hypercholesterolemia, unspecified: Secondary | ICD-10-CM | POA: Diagnosis not present

## 2019-01-25 DIAGNOSIS — J9601 Acute respiratory failure with hypoxia: Secondary | ICD-10-CM | POA: Diagnosis not present

## 2019-01-25 DIAGNOSIS — U071 COVID-19: Secondary | ICD-10-CM | POA: Diagnosis not present

## 2019-01-25 LAB — COMPREHENSIVE METABOLIC PANEL
ALT: 35 U/L (ref 0–44)
AST: 32 U/L (ref 15–41)
Albumin: 2.8 g/dL — ABNORMAL LOW (ref 3.5–5.0)
Alkaline Phosphatase: 118 U/L (ref 38–126)
Anion gap: 16 — ABNORMAL HIGH (ref 5–15)
BUN: 41 mg/dL — ABNORMAL HIGH (ref 8–23)
CO2: 19 mmol/L — ABNORMAL LOW (ref 22–32)
Calcium: 8.1 mg/dL — ABNORMAL LOW (ref 8.9–10.3)
Chloride: 116 mmol/L — ABNORMAL HIGH (ref 98–111)
Creatinine, Ser: 0.94 mg/dL (ref 0.44–1.00)
GFR calc Af Amer: >60 mL/min (ref >60)
GFR calc non Af Amer: 60 mL/min — ABNORMAL LOW (ref >60)
Glucose, Bld: 240 mg/dL — ABNORMAL HIGH (ref 70–99)
Potassium: 3.6 mmol/L (ref 3.5–5.1)
Sodium: 151 mmol/L — ABNORMAL HIGH (ref 135–145)
Total Bilirubin: 0.7 mg/dL (ref 0.3–1.2)
Total Protein: 6.2 g/dL — ABNORMAL LOW (ref 6.5–8.1)

## 2019-01-25 LAB — CBC WITH DIFFERENTIAL/PLATELET
Abs Immature Granulocytes: 0.17 10*3/uL — ABNORMAL HIGH (ref 0.00–0.07)
Basophils Absolute: 0 10*3/uL (ref 0.0–0.1)
Basophils Relative: 0 %
Eosinophils Absolute: 0 10*3/uL (ref 0.0–0.5)
Eosinophils Relative: 0 %
HCT: 42.5 % (ref 36.0–46.0)
Hemoglobin: 13.6 g/dL (ref 12.0–15.0)
Immature Granulocytes: 1 %
Lymphocytes Relative: 3 %
Lymphs Abs: 0.5 10*3/uL — ABNORMAL LOW (ref 0.7–4.0)
MCH: 29.3 pg (ref 26.0–34.0)
MCHC: 32 g/dL (ref 30.0–36.0)
MCV: 91.6 fL (ref 80.0–100.0)
Monocytes Absolute: 0.7 10*3/uL (ref 0.1–1.0)
Monocytes Relative: 4 %
Neutro Abs: 15 10*3/uL — ABNORMAL HIGH (ref 1.7–7.7)
Neutrophils Relative %: 92 %
Platelets: 329 10*3/uL (ref 150–400)
RBC: 4.64 MIL/uL (ref 3.87–5.11)
RDW: 15.4 % (ref 11.5–15.5)
WBC: 16.4 10*3/uL — ABNORMAL HIGH (ref 4.0–10.5)
nRBC: 0.1 % (ref 0.0–0.2)

## 2019-01-25 LAB — C-REACTIVE PROTEIN: CRP: 1.3 mg/dL — ABNORMAL HIGH (ref <1.0)

## 2019-01-25 LAB — GLUCOSE, CAPILLARY
Glucose-Capillary: 206 mg/dL — ABNORMAL HIGH (ref 70–99)
Glucose-Capillary: 215 mg/dL — ABNORMAL HIGH (ref 70–99)
Glucose-Capillary: 229 mg/dL — ABNORMAL HIGH (ref 70–99)
Glucose-Capillary: 237 mg/dL — ABNORMAL HIGH (ref 70–99)

## 2019-01-25 LAB — PHOSPHORUS
Phosphorus: 2.9 mg/dL (ref 2.5–4.6)
Phosphorus: 2.7 mg/dL (ref 2.5–4.6)

## 2019-01-25 LAB — D-DIMER, QUANTITATIVE: D-Dimer, Quant: 1.49 ug/mL-FEU — ABNORMAL HIGH (ref 0.00–0.50)

## 2019-01-25 LAB — MAGNESIUM
Magnesium: 2.5 mg/dL — ABNORMAL HIGH (ref 1.7–2.4)
Magnesium: 2.5 mg/dL — ABNORMAL HIGH (ref 1.7–2.4)

## 2019-01-25 LAB — BRAIN NATRIURETIC PEPTIDE: B Natriuretic Peptide: 292.1 pg/mL — ABNORMAL HIGH (ref 0.0–100.0)

## 2019-01-25 LAB — PROCALCITONIN: Procalcitonin: <0.1 ng/mL

## 2019-01-25 MED ORDER — ALBUMIN HUMAN 25 % IV SOLN
25.0000 g | Freq: Once | INTRAVENOUS | Status: AC
  Administered 2019-01-25: 09:00:00 25 g via INTRAVENOUS
  Filled 2019-01-25: qty 50

## 2019-01-25 MED ORDER — VITAL HIGH PROTEIN PO LIQD
1000.0000 mL | ORAL | Status: DC
  Administered 2019-01-25: 1000 mL

## 2019-01-25 MED ORDER — GLUCERNA 1.2 CAL PO LIQD
1000.0000 mL | ORAL | Status: DC
  Administered 2019-01-25 – 2019-01-29 (×3): 1000 mL
  Filled 2019-01-25 (×8): qty 1000

## 2019-01-25 MED ORDER — PRO-STAT SUGAR FREE PO LIQD: 30.0000 mL | Freq: Two times a day (BID) | ORAL | Status: DC

## 2019-01-25 MED ORDER — DEXTROSE 5 % IV SOLN
INTRAVENOUS | Status: DC
  Administered 2019-01-25 – 2019-01-28 (×7): via INTRAVENOUS

## 2019-01-25 MED ORDER — ALBUMIN HUMAN 25 % IV SOLN
12.5000 g | Freq: Once | INTRAVENOUS | Status: DC
  Filled 2019-01-25: qty 50

## 2019-01-25 NOTE — Plan of Care (Signed)
Patient Summary: Patient on HFNC 25LFiO2100% throughout most of the night. Georgina Peer RT began to titrate FiO2. Patient began to sat in low 80's. Patient O2 demand required NRB 100% in addition to the HFNC. Patient now on 25L 90% FiO2 with NRB. Patient sats >92%. At 0000 patient given morphine 2mg  IV for increased indications pain. See MAR re: medications administered. Patient family (daughter and son) updated via [place] at 54, addressed all questions and concerns. See MAR re: medications administered. See flowsheet re: assessent. Patient on cardizem gtt throughout the night re: new onset Afib with RVR. Patient now NSR. Patient appears to be resting comfortably. Bed in low locked position.  Problem: Education: Goal: Knowledge of risk factors and measures for prevention of condition will improve Outcome: Progressing   Problem: Coping: Goal: Psychosocial and spiritual needs will be supported Outcome: Progressing   Problem: Respiratory: Goal: Will maintain a patent airway Outcome: Progressing Goal: Complications related to the disease process, condition or treatment will be avoided or minimized Outcome: Progressing   Problem: Education: Goal: Knowledge of General Education information will improve Description: Including pain rating scale, medication(s)/side effects and non-pharmacologic comfort measures Outcome: Progressing   Problem: Health Behavior/Discharge Planning: Goal: Ability to manage health-related needs will improve Outcome: Progressing   Problem: Clinical Measurements: Goal: Ability to maintain clinical measurements within normal limits will improve Outcome: Progressing Goal: Will remain free from infection Outcome: Progressing Goal: Diagnostic test results will improve Outcome: Progressing Goal: Respiratory complications will improve Outcome: Progressing Goal: Cardiovascular complication will be avoided Outcome: Progressing   Problem: Activity: Goal: Risk for activity  intolerance will decrease Outcome: Progressing   Problem: Nutrition: Goal: Adequate nutrition will be maintained Outcome: Progressing   Problem: Coping: Goal: Level of anxiety will decrease Outcome: Progressing   Problem: Elimination: Goal: Will not experience complications related to bowel motility Outcome: Progressing Goal: Will not experience complications related to urinary retention Outcome: Progressing   Problem: Pain Managment: Goal: General experience of comfort will improve Outcome: Progressing   Problem: Safety: Goal: Ability to remain free from injury will improve Outcome: Progressing   Problem: Skin Integrity: Goal: Risk for impaired skin integrity will decrease Outcome: Progressing

## 2019-01-25 NOTE — Progress Notes (Signed)
OT Cancellation Note  Patient Details Name: Ashley Cook MRN: [id number] DOB: 15-Jan-1946   Cancelled Treatment:    Reason Eval/Treat Not Completed: Medical issues which prohibited therapy, RN request to hold therapies at this time, noted pt also currently with bear hugger. Will follow up for OT eval as able.  Lou Cal, OT [facility] Pager [phone] Office [phone]   Raymondo Band 01/25/2019, 8:51 AM

## 2019-01-25 NOTE — Progress Notes (Signed)
PT Cancellation Note  Patient Details Name: Ashley Cook MRN: [id number] DOB: Feb 24, 1946   Cancelled Treatment:    Reason Eval/Treat Not Completed: Medical issues which prohibited therapy, per RN , pt. Requiring hi O2 needs. Will check back another day.   [name] 01/25/2019, 1:34 PM  [place] Pager [phone] Office [phone]

## 2019-01-25 NOTE — Procedures (Signed)
Cortrak  Person Inserting Tube:  Ashley Cook, RD Tube Type:  Cortrak - 43 inches Tube Location:  Right nare Initial Placement:  Stomach Secured by: Bridle Technique Used to Measure Tube Placement:  Documented cm marking at nare/ corner of mouth Cortrak Secured At:  64 cm    Cortrak Tube Team Note:  Consult received to place a Cortrak feeding tube.   No x-ray is required. RN may begin using tube.   If the tube becomes dislodged please keep the tube and contact the Cortrak team at [email or web address] (password [email or web address]) for replacement.  If after hours and replacement cannot be delayed, place a NG tube and confirm placement with an abdominal x-ray.    [place], [place], [place] Pager [phone] After Hours Pager

## 2019-01-25 NOTE — Progress Notes (Signed)
Initial Nutrition Assessment  DOCUMENTATION CODES:   Not applicable  INTERVENTION:    Glucerna 1.2 at 20 ml/h, increase by 10 ml every 12 hours to goal rate of 50 ml/h (1200 ml per day)   Provides 1440 kcal, 72 gm protein, 966 ml free water daily   Monitor magnesium, potassium, and phosphorus levels for at least 3 days, MD to replete as needed, as pt is at risk for refeeding syndrome given minimal intake since admission, suspected malnutrition.  NUTRITION DIAGNOSIS:   Increased nutrient needs related to acute illness(COVID PNA) as evidenced by estimated needs.  GOAL:   Patient will meet greater than or equal to 90% of their needs  MONITOR:   TF tolerance, Labs, Skin, Diet advancement  REASON FOR ASSESSMENT:   Consult Enteral/tube feeding initiation and management  ASSESSMENT:   73 yo female admitted with COVID-19 PNA. PMH includes COPD, asthma.   Noted guarded prognosis with potential to move to full comfort measures depending on how patient progresses.  Patient is NPO. SLP has been unable to complete swallow evaluation. Patient refused PO's yesterday and not appropriate for evaluation today due to decline in medical status.   Cortrak was placed this morning. Received MD Consult for TF initiation and management. Patient is at risk for refeeding syndrome given minimal intake since admission (5 days) and suspected malnutrition with BMI=18.6. Will advance TF very slowly and check mag, phos, and potassium.  Labs reviewed. Sodium 149 (H), potassium 5 WDL CBG's: 116-206  Medications reviewed and include solu-medrol, kayexalate, vitamin C, zinc sulfate.   No weights available except admission weight 47.6 kg.   NUTRITION - FOCUSED PHYSICAL EXAM:  deferred  Diet Order:   Diet Order            Diet NPO time specified  Diet effective now              EDUCATION NEEDS:   Not appropriate for education at this time  Skin:  Skin Assessment: Reviewed RN  Assessment(MASD to groin & buttocks)  Last BM:  11/25 type 6  Height:   Ht Readings from Last 1 Encounters:  01/23/2019 5\' 3"  (1.6 m)    Weight:   Wt Readings from Last 1 Encounters:  01/24/2019 47.6 kg    Ideal Body Weight:  52.3 kg  BMI:  Body mass index is 18.6 kg/m.  Estimated Nutritional Needs:   Kcal:  1400-1600  Protein:  70-80 gm  Fluid:  >/= 1.5 L   Molli Barrows, RD, LDN, [place] Pager [phone] After Hours Pager [phone]

## 2019-01-25 NOTE — Progress Notes (Signed)
PROGRESS NOTE    Ashley Cook  ZOX:[id number] DOB: August 30, 1945 DOA: 01/18/2019 PCP: Koren ShiverMasneri, Shannon M, DO   Brief Narrative:  73 year old female PMHx    presented with dyspnea and cough. She does have significant past medical history of asthma, and COPD. She tested positive for COVID-19 November 15, she has been symptomatic with dyspnea, cough, fatigue and body aches, she had the symptoms for 3 to 4 days and when she became extremely short of breath she came to the ER where she was diagnosed with acute hypoxic respiratory failure secondary to COVID-19 pneumonia she was placed on 4 L oxygen and was started on appropriate treatment and [facility]   Subjective: Afebrile last 24 hours, but hypothermic this morning now with warming blanket on.   Assessment & Plan:   Principal Problem:   Pneumonia due to COVID-19 virus Active Problems:   Acute respiratory failure with hypoxia (HCC)   HLD (hyperlipidemia)   COPD (chronic obstructive pulmonary disease) (HCC)   COVID-19  Covid pneumonia/acute respiratory failure with hypoxia COVID-19 Labs  Recent Labs    01/23/19 0445 01/24/19 0530 01/25/19 0500  DDIMER 1.95* 1.81* 1.49*  FERRITIN 562*  --   --   CRP 5.1* 2.8* 1.3*    Lab Results  Component Value Date   SARSCOV2NAA POSITIVE (A) 01/14/2019   SARSCOV2NAA Not Detected 01/11/2019  -she has severe disease and unfortunately she waited at home for several days with shortness of breath before presenting to the hospital -Completed Remdesivir per pharmacy -Solu-Medrol 60 mg BID -Actemra -Covid convalescent plasma -Titrate O2 to maintain SPO2> 88% -Combivent if patient can cooperate -Flutter valve if patient can cooperate -Incentive spirometry if patient can cooperate  Aspiration pneumonia -Patient most likely microaspirating -Continue Unasyn x7 days -Trend procalcitonin  COPD -See Covid pneumonia  Hypernatremia -11/25 Albumin 25 g.  Administer prior to starting  D5W -11/25 D5W 50 ml/hr    A. Fib -Currently NSR.  Per RN sometime overnight transitioned into NSR while on Cardizem drip. -We will keep patient on drip for 24 hours before transitioning to p.o. if she survives  Elevated proBNP -Intravascularly dehydrated with third spacing -Hold Lasix. -See hypernatremia  Metabolic encephalopathy -Correct underlying disorder -Hold all sedating medication  HLD -Restart statin once CoreTtrak placed    DVT prophylaxis: Lovenox Code Status: DNR Family Communication: 11/25 spoke at length with Alvino Chapelllen (daughter) and husband discussed goals of care.  We will continue current level of care if patient begins to decline or becomes uncomfortable will contact family immediately and transition to comfort care.  Family aware patient will likely not to survive this hospitalization Disposition Plan: TBD   Consultants:  PCCM  Procedures/Significant Events:     I have personally reviewed and interpreted all radiology studies and my findings are as above.  VENTILATOR SETTINGS: HFNC+ partial NR FiO2; 80% Flow; 25 L/min SPO2; 92%   Cultures   Antimicrobials: Anti-infectives (From admission, onward)   Start     Stop   01/23/19 1100  Ampicillin-Sulbactam (UNASYN) 3 g in sodium chloride 0.9 % 100 mL IVPB         01/21/19 1000  remdesivir 100 mg in sodium chloride 0.9 % 250 mL IVPB  Status:  Discontinued     01/20/19 1015   01/21/19 1000  azithromycin (ZITHROMAX) tablet 500 mg  Status:  Discontinued     01/23/19 1028   01/20/19 1800  fluconazole (DIFLUCAN) tablet 150 mg     01/20/19 1828   01/20/19 1600  remdesivir 100 mg in sodium chloride 0.9 % 250 mL IVPB     01/23/19 1201   01/20/19 0015  remdesivir 200 mg in sodium chloride 0.9 % 250 mL IVPB     01/20/19 0141   01/10/2019 2045  cefTRIAXone (ROCEPHIN) 2 g in sodium chloride 0.9 % 100 mL IVPB  Status:  Discontinued     01/23/19 1028   01/10/2019 2045  azithromycin (ZITHROMAX) 500 mg in sodium  chloride 0.9 % 250 mL IVPB  Status:  Discontinued     01/21/19 0925       Devices    LINES / TUBES:      Continuous Infusions: . ampicillin-sulbactam (UNASYN) IV Stopped (01/25/19 0217)  . diltiazem (CARDIZEM) infusion 15 mg/hr (01/25/19 0703)     Objective: Vitals:   01/25/19 0400 01/25/19 0500 01/25/19 0600 01/25/19 0700  BP: (!) 143/62 131/67 136/60 (!) 142/61  Pulse: 80 79 83 78  Resp: (!) 22 20 (!) 25 (!) 27  Temp: (!) 96.7 F (35.9 C)     TempSrc: Axillary     SpO2: 92% 93% 90% 93%  Weight:      Height:        Intake/Output Summary (Last 24 hours) at 01/25/2019 0802 Last data filed at 01/25/2019 0600 Gross per 24 hour  Intake 2623.29 ml  Output 1415 ml  Net 1208.29 ml   Filed Weights   01/08/2019 1910  Weight: 47.6 kg    Examination:  General: Opens eyes, responds to painful stimuli, positive acute respiratory distress, cachectic Eyes: negative scleral hemorrhage, negative anisocoria, negative icterus ENT: Negative Runny nose, negative gingival bleeding, extremely dry mucosa Neck:  Negative scars, masses, torticollis, lymphadenopathy, JVD Lungs: Tachypneic, positive rhonchi bilaterally without wheezes or crackles Cardiovascular: Regular rate and rhythm without murmur gallop or rub normal S1 and S2 Abdomen: negative abdominal pain, nondistended, positive soft, bowel sounds, no rebound, no ascites, no appreciable mass Extremities: No significant cyanosis, clubbing, bilateral upper extremity/lower extremity +1 edema Skin: Negative rashes, lesions, ulcers Psychiatric: Unable to evaluate secondary to altered mental status Central nervous system: Eyes open, does not follow commands, withdraws to painful stimuli  .     Data Reviewed: Care during the described time interval was provided by me .  I have reviewed this patient's available data, including medical history, events of note, physical examination, and all test results as part of my evaluation.    CBC: Recent Labs  Lab 01/21/2019 1938 01/20/19 0511 01/21/19 0915 01/21/19 1856 01/23/19 0818 01/24/19 0530 01/25/19 0500  WBC 9.0 10.3 15.1*  --   --  17.7* 16.4*  NEUTROABS 7.6 9.3* 12.9*  --   --  15.9* 15.0*  HGB 14.1 12.7 13.4 11.6* 12.2 13.8 13.6  HCT 41.9 38.3 40.3 34.0* 36.0 41.8 42.5  MCV 92.3 92.3 91.0  --   --  90.1 91.6  PLT 180 175 262  --   --  352 132   Basic Metabolic Panel: Recent Labs  Lab 01/21/19 0915  01/22/19 0110 01/23/19 0445 01/23/19 0818 01/24/19 0530 01/25/19 0500  NA 142   < > 144 149* 150* 150* 151*  K 4.5   < > 4.2 5.0 3.8 3.3* 3.6  CL 113*  --  117* 121*  --  115* 116*  CO2 16*  --  17* 14*  --  18* 19*  GLUCOSE 120*  --  142* 146*  --  174* 240*  BUN 25*  --  31* 33*  --  36* 41*  CREATININE 0.76  --  0.78 0.75  --  0.94 0.94  CALCIUM 8.2*  --  8.4* 8.4*  --  8.4* 8.1*  MG  --   --   --   --   --  2.3 2.5*   < > = values in this interval not displayed.   GFR: Estimated Creatinine Clearance: 39.5 mL/min (by C-G formula based on SCr of 0.94 mg/dL). Liver Function Tests: Recent Labs  Lab 01/21/19 0915 01/22/19 0110 01/23/19 0445 01/24/19 0530 01/25/19 0500  AST 66* 61* 64* 38 32  ALT 48* 46* 46* 40 35  ALKPHOS 84 96 126 123 118  BILITOT 0.6 0.3 0.8 0.9 0.7  PROT 6.2* 6.1* 6.3* 6.6 6.2*  ALBUMIN 2.7* 2.7* 2.7* 2.9* 2.8*   No results for input(s): LIPASE, AMYLASE in the last 168 hours. No results for input(s): AMMONIA in the last 168 hours. Coagulation Profile: No results for input(s): INR, PROTIME in the last 168 hours. Cardiac Enzymes: No results for input(s): CKTOTAL, CKMB, CKMBINDEX, TROPONINI in the last 168 hours. BNP (last 3 results) No results for input(s): PROBNP in the last 8760 hours. HbA1C: No results for input(s): HGBA1C in the last 72 hours. CBG: Recent Labs  Lab 01/22/19 0251 01/23/19 0812 01/25/19 0740  GLUCAP 126* 116* 206*   Lipid Profile: No results for input(s): CHOL, HDL, LDLCALC, TRIG, CHOLHDL,  LDLDIRECT in the last 72 hours. Thyroid Function Tests: Recent Labs    01/24/19 0530  TSH 0.243*   Anemia Panel: Recent Labs    01/23/19 0445  FERRITIN 562*   Urine analysis: No results found for: COLORURINE, APPEARANCEUR, LABSPEC, PHURINE, GLUCOSEU, HGBUR, BILIRUBINUR, KETONESUR, PROTEINUR, UROBILINOGEN, NITRITE, LEUKOCYTESUR Sepsis Labs: @LABRCNTIP (procalcitonin:4,lacticidven:4)  ) Recent Results (from the past 240 hour(s))  Blood Culture (routine x 2)     Status: None   Collection Time: 01/26/2019  7:34 PM   Specimen: BLOOD  Result Value Ref Range Status   Specimen Description   Final    BLOOD LEFT ANTECUBITAL Performed at [facility] [address] [place], [place]    Special Requests   Final    BOTTLES DRAWN AEROBIC AND ANAEROBIC Blood Culture adequate volume Performed at [facility], [address], [place], [place]    Culture   Final    NO GROWTH 5 DAYS Performed at [facility], [address] [place], [place]    Report Status 01/24/2019 FINAL  Final  Blood Culture (routine x 2)     Status: None   Collection Time: 2019/01/26  7:34 PM   Specimen: BLOOD LEFT FOREARM  Result Value Ref Range Status   Specimen Description   Final    BLOOD LEFT FOREARM Performed at [facility], [address] [place], [place]    Special Requests   Final    BOTTLES DRAWN AEROBIC AND ANAEROBIC Blood Culture results may not be optimal due to an inadequate volume of blood received in culture bottles Performed at [facility], [address], [place], [place]    Culture   Final    NO GROWTH 5 DAYS Performed at [facility], [address], [place], [place]    Report Status 01/24/2019 FINAL  Final  SARS CORONAVIRUS 2 (TAT 6-24 HRS) Nasopharyngeal Nasopharyngeal Swab     Status: Abnormal   Collection Time: 01/26/2019 11:44 PM   Specimen:  Nasopharyngeal Swab  Result Value Ref Range Status   SARS Coronavirus 2 POSITIVE (A) NEGATIVE Final    Comment: RESULT CALLED TO, READ BACK BY AND VERIFIED WITH: Cyndie Mull RN 9:40 01/20/19 ([email or web address]) (NOTE) SARS-CoV-2 target nucleic acids are DETECTED. The SARS-CoV-2 RNA is generally detectable in upper and lower respiratory specimens during the acute phase of infection. Positive results are indicative of active infection with SARS-CoV-2. Clinical  correlation with patient history and other diagnostic information is necessary to determine patient infection status. Positive results do  not rule out bacterial infection or co-infection with other viruses. The expected result is Negative. Fact Sheet for Patients: HairSlick.no Fact Sheet for Healthcare Providers: quierodirigir.com This test is not yet approved or cleared by the [place] FDA and  has been authorized for detection and/or diagnosis of SARS-CoV-2 by FDA under an Emergency Use Authorization (EUA). This EUA will remain  in effect (meaning this test can be used) for  the duration of the COVID-19 declaration under Section 564(b)(1) of the Act, 21 U.S.C. section 360bbb-3(b)(1), unless the authorization is terminated or revoked sooner. Performed at [facility], [address], [place], [place] [address]   MRSA PCR Screening     Status: None   Collection Time: 01/22/19  2:57 AM   Specimen: Nasal Mucosa; Nasopharyngeal  Result Value Ref Range Status   MRSA by PCR NEGATIVE NEGATIVE Final    Comment:        The GeneXpert MRSA Assay (FDA approved for NASAL specimens only), is one component of a comprehensive MRSA colonization surveillance program. It is not intended to diagnose MRSA infection nor to guide or monitor treatment for MRSA infections. Performed at [facility], [address] [place], [place] [address]          Radiology  Studies: Dg Chest 1 View  Result Date: 01/23/2019 CLINICAL DATA:  73 year old female with history of oxygen desaturation. EXAM: CHEST  1 VIEW COMPARISON:  Chest x-ray 01/21/2019. FINDINGS: Hazy ill-defined opacities and widespread areas of interstitial prominence are noted throughout the lungs bilaterally, asymmetrically distributed, most severe throughout the left mid to lower lung. This distribution has shifted over the past several days, likely to reflect evolving multilobar atypical pneumonia. No pleural effusions. No evidence of pulmonary edema. Heart size is normal. Upper mediastinal contours are within normal limits. Aortic atherosclerosis. IMPRESSION: 1. Multilobar bilateral pneumonia with similar aeration compared to the recent prior study from January 21, 2019, as above. 2. Aortic atherosclerosis. Electronically Signed   By: Trudie Reed M.D.   On: 01/23/2019 09:16        Scheduled Meds: . Chlorhexidine Gluconate Cloth  6 each Topical Daily  . clonazepam  0.5 mg Oral BID  . cloNIDine  0.2 mg Transdermal Q Mon  . enoxaparin (LOVENOX) injection  40 mg Subcutaneous BID  . fluticasone furoate-vilanterol  1 puff Inhalation Daily  . magic mouthwash w/lidocaine  5 mL Oral QID  . mouth rinse  15 mL Mouth Rinse BID  . methylPREDNISolone (SOLU-MEDROL) injection  60 mg Intravenous Q12H  . nystatin  5 mL Oral QID  . pantoprazole (PROTONIX) IV  40 mg Intravenous Q24H  . sodium polystyrene  15 g Oral Once  . vitamin C  500 mg Oral Daily  . zinc sulfate  220 mg Oral Daily   Continuous Infusions: . ampicillin-sulbactam (UNASYN) IV Stopped (01/25/19 0217)  . diltiazem (CARDIZEM) infusion 15 mg/hr (01/25/19 0703)     LOS: 6 days   The patient is  critically ill with multiple organ systems failure and requires high complexity decision making for assessment and support, frequent evaluation and titration of therapies, application of advanced monitoring technologies and extensive  interpretation of multiple databases. Critical Care Time devoted to patient care services described in this note  Time spent: 40 minutes     Saryiah Bencosme, Roselind Messier, MD [facility] Pager [phone]  If 7PM-7AM, please contact night-coverage [email or web address] Password [email or web address] 01/25/2019, 8:02 AM

## 2019-01-25 NOTE — Progress Notes (Signed)
Marcello Moores, CN sent patient's belongings of a cell phone and a Games developer to security.

## 2019-01-25 NOTE — Progress Notes (Signed)
[facility] PCCM  Case dicussed with Dr. Sherral Hammers and on interdisciplinary rounds Remains DNR On my brief exam today she appeared to be in more respiratory distress than in the last 2 days Would favor using morphine as needed for relief of dyspnea while still providing full medical support in hopes of a recovery  Roselie Awkward, MD [facility] Pager: [phone] Cell: [phone] If no response, call [phone]

## 2019-01-25 NOTE — Progress Notes (Signed)
SLP Cancellation Note  Patient Details Name: Ashley Cook MRN: [id number] DOB: 1945/03/27   Cancelled treatment:       Reason Eval/Treat Not Completed: Medical issues which prohibited therapy. Pt not appropriate for swallow evaluation today per RN. SLP will be next available on Friday 11/27. I can be accessed by secure chat today if anything changes.    Venita Sheffield Shawntia Mangal 01/25/2019, 10:57 AM  Pollyann Glen, M.A. [facility] Acute Environmental education officer [phone] Office [phone]

## 2019-01-25 NOTE — Progress Notes (Signed)
Dr. Lake Bells at bedside to round on pt. Informed of concern for aspiration with pt being very lethargic and having copious amounts of thick yellow secretions with coughing. Per MD, hold tube feeding at this time. Will address plan of care in AM.

## 2019-01-26 DIAGNOSIS — E876 Hypokalemia: Secondary | ICD-10-CM

## 2019-01-26 DIAGNOSIS — E7800 Pure hypercholesterolemia, unspecified: Secondary | ICD-10-CM | POA: Diagnosis not present

## 2019-01-26 DIAGNOSIS — G9341 Metabolic encephalopathy: Secondary | ICD-10-CM

## 2019-01-26 DIAGNOSIS — R0902 Hypoxemia: Secondary | ICD-10-CM

## 2019-01-26 DIAGNOSIS — E870 Hyperosmolality and hypernatremia: Secondary | ICD-10-CM

## 2019-01-26 DIAGNOSIS — J9601 Acute respiratory failure with hypoxia: Secondary | ICD-10-CM | POA: Diagnosis not present

## 2019-01-26 DIAGNOSIS — J431 Panlobular emphysema: Secondary | ICD-10-CM | POA: Diagnosis not present

## 2019-01-26 DIAGNOSIS — I4891 Unspecified atrial fibrillation: Secondary | ICD-10-CM

## 2019-01-26 DIAGNOSIS — E440 Moderate protein-calorie malnutrition: Secondary | ICD-10-CM

## 2019-01-26 DIAGNOSIS — U071 COVID-19: Secondary | ICD-10-CM | POA: Diagnosis not present

## 2019-01-26 LAB — MAGNESIUM
Magnesium: 2.5 mg/dL — ABNORMAL HIGH (ref 1.7–2.4)
Magnesium: 2.7 mg/dL — ABNORMAL HIGH (ref 1.7–2.4)

## 2019-01-26 LAB — CBC WITH DIFFERENTIAL/PLATELET
Abs Immature Granulocytes: 0.09 10*3/uL — ABNORMAL HIGH (ref 0.00–0.07)
Basophils Absolute: 0 10*3/uL (ref 0.0–0.1)
Basophils Relative: 0 %
Eosinophils Absolute: 0 10*3/uL (ref 0.0–0.5)
Eosinophils Relative: 0 %
HCT: 38.8 % (ref 36.0–46.0)
Hemoglobin: 12.8 g/dL (ref 12.0–15.0)
Immature Granulocytes: 1 %
Lymphocytes Relative: 2 %
Lymphs Abs: 0.3 10*3/uL — ABNORMAL LOW (ref 0.7–4.0)
MCH: 30.1 pg (ref 26.0–34.0)
MCHC: 33 g/dL (ref 30.0–36.0)
MCV: 91.3 fL (ref 80.0–100.0)
Monocytes Absolute: 0.5 10*3/uL (ref 0.1–1.0)
Monocytes Relative: 4 %
Neutro Abs: 12.9 10*3/uL — ABNORMAL HIGH (ref 1.7–7.7)
Neutrophils Relative %: 93 %
Platelets: 246 10*3/uL (ref 150–400)
RBC: 4.25 MIL/uL (ref 3.87–5.11)
RDW: 15 % (ref 11.5–15.5)
WBC: 13.8 10*3/uL — ABNORMAL HIGH (ref 4.0–10.5)
nRBC: 0.1 % (ref 0.0–0.2)

## 2019-01-26 LAB — COMPREHENSIVE METABOLIC PANEL
ALT: 29 U/L (ref 0–44)
AST: 25 U/L (ref 15–41)
Albumin: 3 g/dL — ABNORMAL LOW (ref 3.5–5.0)
Alkaline Phosphatase: 132 U/L — ABNORMAL HIGH (ref 38–126)
Anion gap: 14 (ref 5–15)
BUN: 37 mg/dL — ABNORMAL HIGH (ref 8–23)
CO2: 20 mmol/L — ABNORMAL LOW (ref 22–32)
Calcium: 8.1 mg/dL — ABNORMAL LOW (ref 8.9–10.3)
Chloride: 114 mmol/L — ABNORMAL HIGH (ref 98–111)
Creatinine, Ser: 0.91 mg/dL (ref 0.44–1.00)
GFR calc Af Amer: >60 mL/min (ref >60)
GFR calc non Af Amer: >60 mL/min (ref >60)
Glucose, Bld: 227 mg/dL — ABNORMAL HIGH (ref 70–99)
Potassium: 3.5 mmol/L (ref 3.5–5.1)
Sodium: 148 mmol/L — ABNORMAL HIGH (ref 135–145)
Total Bilirubin: 0.8 mg/dL (ref 0.3–1.2)
Total Protein: 6 g/dL — ABNORMAL LOW (ref 6.5–8.1)

## 2019-01-26 LAB — GLUCOSE, CAPILLARY
Glucose-Capillary: 196 mg/dL — ABNORMAL HIGH (ref 70–99)
Glucose-Capillary: 206 mg/dL — ABNORMAL HIGH (ref 70–99)
Glucose-Capillary: 219 mg/dL — ABNORMAL HIGH (ref 70–99)
Glucose-Capillary: 227 mg/dL — ABNORMAL HIGH (ref 70–99)
Glucose-Capillary: 229 mg/dL — ABNORMAL HIGH (ref 70–99)
Glucose-Capillary: 235 mg/dL — ABNORMAL HIGH (ref 70–99)

## 2019-01-26 LAB — C-REACTIVE PROTEIN: CRP: <0.8 mg/dL (ref <1.0)

## 2019-01-26 LAB — D-DIMER, QUANTITATIVE: D-Dimer, Quant: 1.73 ug/mL-FEU — ABNORMAL HIGH (ref 0.00–0.50)

## 2019-01-26 LAB — PROCALCITONIN: Procalcitonin: 0.1 ng/mL

## 2019-01-26 LAB — PHOSPHORUS: Phosphorus: 2.7 mg/dL (ref 2.5–4.6)

## 2019-01-26 LAB — BRAIN NATRIURETIC PEPTIDE: B Natriuretic Peptide: 221.5 pg/mL — ABNORMAL HIGH (ref 0.0–100.0)

## 2019-01-26 MED ORDER — MORPHINE SULFATE (PF) 2 MG/ML IV SOLN
1.0000 mg | INTRAVENOUS | Status: DC | PRN
  Administered 2019-01-26 – 2019-01-30 (×11): 2 mg via INTRAVENOUS
  Filled 2019-01-26 (×11): qty 1

## 2019-01-26 MED ORDER — FREE WATER
100.0000 mL | Freq: Three times a day (TID) | Status: DC
  Administered 2019-01-26 – 2019-01-27 (×3): 100 mL

## 2019-01-26 MED ORDER — POTASSIUM CHLORIDE 20 MEQ/15ML (10%) PO SOLN
50.0000 meq | Freq: Once | ORAL | Status: AC
  Administered 2019-01-26: 50 meq
  Filled 2019-01-26: qty 45

## 2019-01-26 MED ORDER — DILTIAZEM 12 MG/ML ORAL SUSPENSION
60.0000 mg | Freq: Four times a day (QID) | ORAL | Status: DC
  Administered 2019-01-26 – 2019-01-29 (×14): 60 mg
  Filled 2019-01-26 (×21): qty 6

## 2019-01-26 NOTE — Progress Notes (Signed)
Laying in bed on nonrebreather mask.  Minimal abdominal accessory muscle use, but does not appear air hungry.  Respiratory rate controlled less than 20.  Continue nonrebreather.  Recommend using morphine for pain and to address discomfort from air hunger as needed.  Julian Hy, DO 01/26/19 12:38 PM [facility]

## 2019-01-26 NOTE — Progress Notes (Signed)
MD called regarding blood sugars. States he will review chart prior to ordering anything.

## 2019-01-26 NOTE — Progress Notes (Signed)
CBG 219, PER ORDER TO NOTIFY MD OVER 160. MD NOTIFIED. AWAITING ORDERS.

## 2019-01-26 NOTE — Progress Notes (Addendum)
PROGRESS NOTE    Ashley Cook  ZWC:[id number] DOB: 06-25-1945 DOA: 01/14/2019 PCP: Lyman Bishop, DO   Brief Narrative:  73 year old female PMHx    presented with dyspnea and cough. She does have significant past medical history of asthma, and COPD. She tested positive for COVID-19 November 15, she has been symptomatic with dyspnea, cough, fatigue and body aches, she had the symptoms for 3 to 4 days and when she became extremely short of breath she came to the ER where she was diagnosed with acute hypoxic respiratory failure secondary to COVID-19 pneumonia she was placed on 4 L oxygen and was started on appropriate treatment and sent to [facility].   Subjective: 11/26 last 24 hours afebrile, opens eyes spontaneously follows some commands, says yes and no to some questions.     Assessment & Plan:   Principal Problem:   Pneumonia due to COVID-19 virus Active Problems:   Acute respiratory failure with hypoxia (HCC)   HLD (hyperlipidemia)   COPD (chronic obstructive pulmonary disease) ([place])   COVID-19  Covid pneumonia/acute respiratory failure with hypoxia COVID-19 Labs  Recent Labs    01/24/19 0530 01/25/19 0500  DDIMER 1.81* 1.49*  CRP 2.8* 1.3*    Lab Results  Component Value Date   SARSCOV2NAA POSITIVE (A) 01/09/2019   [place] Not Detected 01/11/2019  -she has severe disease and unfortunately she waited at home for several days with shortness of breath before presenting to the hospital -Completed Remdesivir per pharmacy -Solu-Medrol 60 mg BID -11/21 Covid convalescent plasma -11/22 Actemra x1 -Titrate O2 to maintain SPO2> 88% -Combivent if patient can cooperate -Flutter valve if patient can cooperate -Incentive spirometry if patient can cooperate  Aspiration pneumonia -Patient most likely microaspirating -Continue Unasyn x7 days -Trend procalcitonin  COPD -See Covid pneumonia  Hypernatremia -11/25 Albumin 25 g.  Administer prior to starting  D5W -11/25 D5W 50 ml/hr   -11/26 free water 142ml TID  Hypokalemia -Potassium goal> 4 -Potassium 50 mEq  A. Fib -11/26 currently NSR -11/26 DC IV Cardizem -11/26 Cardizem p.o 60 mg QID.   Elevated proBNP -Intravascularly dehydrated with third spacing -Hold Lasix. -See hypernatremia  Metabolic encephalopathy -Correct underlying disorder -Hold all sedating medication  HLD -Restart statin once CoreTtrak placed  Moderate protein calorie malnutrition -Elevate head of bed> 30 degrees.  Aspiration precautions -Tube feeds Glucerna 1.2 Cal 50 ml/hr     DVT prophylaxis: Lovenox Code Status: DNR Family Communication: 11/27 spoke with Dorian Pod (daughter) and husband explained plan of care answered all questions.    Disposition Plan: TBD   Consultants:  PCCM   Procedures/Significant Events:  11/21 Covid convalescent plasma 11/22 Actemra x1     I have personally reviewed and interpreted all radiology studies and my findings are as above.  VENTILATOR SETTINGS: HFNC 11/26 Flow; 35 L/min FiO2; 90% SPO2; 91%   Cultures   Antimicrobials: Anti-infectives (From admission, onward)   Start     Stop   01/23/19 1100  Ampicillin-Sulbactam (UNASYN) 3 g in sodium chloride 0.9 % 100 mL IVPB         01/21/19 1000  remdesivir 100 mg in sodium chloride 0.9 % 250 mL IVPB  Status:  Discontinued     01/20/19 1015   01/21/19 1000  azithromycin (ZITHROMAX) tablet 500 mg  Status:  Discontinued     01/23/19 1028   01/20/19 1800  fluconazole (DIFLUCAN) tablet 150 mg     01/20/19 1828   01/20/19 1600  remdesivir 100 mg in sodium chloride 0.9 %  250 mL IVPB     01/23/19 1201   01/20/19 0015  remdesivir 200 mg in sodium chloride 0.9 % 250 mL IVPB     01/20/19 0141   January 29, 2019 2045  cefTRIAXone (ROCEPHIN) 2 g in sodium chloride 0.9 % 100 mL IVPB  Status:  Discontinued     01/23/19 1028   January 29, 2019 2045  azithromycin (ZITHROMAX) 500 mg in sodium chloride 0.9 % 250 mL IVPB  Status:   Discontinued     01/21/19 0925       Devices    LINES / TUBES:      Continuous Infusions:  ampicillin-sulbactam (UNASYN) IV 200 mL/hr at 01/26/19 0800   dextrose Stopped (01/26/19 0742)   diltiazem (CARDIZEM) infusion 15 mg/hr (01/26/19 0800)   feeding supplement (GLUCERNA 1.2 CAL) 1,000 mL (01/25/19 1630)     Objective: Vitals:   01/26/19 0500 01/26/19 0600 01/26/19 0700 01/26/19 0800  BP: (!) 131/54 (!) 139/59 (!) 113/52 (!) 141/55  Pulse: 74 81 80 79  Resp: 16 20 (!) 21 (!) 21  Temp: 98.1 F (36.7 C) 97.9 F (36.6 C) 97.9 F (36.6 C) 97.7 F (36.5 C)  TempSrc:    Rectal  SpO2: 90% (!) 87% (!) 85% 91%  Weight:      Height:        Intake/Output Summary (Last 24 hours) at 01/26/2019 0850 Last data filed at 01/26/2019 0800 Gross per 24 hour  Intake 2310.25 ml  Output 800 ml  Net 1510.25 ml   Filed Weights   Jan 29, 2019 1910  Weight: 47.6 kg   Physical Exam:  General: Opens eyes, follows some commands, motors yes and no to questions, positive acute respiratory distress, cachectic Eyes: negative scleral hemorrhage, negative anisocoria, negative icterus ENT: Negative Runny nose, negative gingival bleeding, Neck:  Negative scars, masses, torticollis, lymphadenopathy, JVD Lungs: Tachypneic, bibasilar rhonchi (improved from 11/25),  without wheezes or crackles Cardiovascular: Regular rate and rhythm without murmur gallop or rub normal S1 and S2 Abdomen: negative abdominal pain, nondistended, positive soft, bowel sounds, no rebound, no ascites, no appreciable mass Extremities: No significant cyanosis, clubbing, or edema bilateral lower extremities Skin: Negative rashes, lesions, ulcers Psychiatric: Unable to evaluate secondary to altered mental status Central nervous system: Opens eyes, follows some commands, motor yes/no to questions  .     Data Reviewed: Care during the described time interval was provided by me .  I have reviewed this patient's  available data, including medical history, events of note, physical examination, and all test results as part of my evaluation.   CBC: Recent Labs  Lab 01/20/19 0511 01/21/19 0915 01/21/19 1856 01/23/19 0818 01/24/19 0530 01/25/19 0500 01/26/19 0735  WBC 10.3 15.1*  --   --  17.7* 16.4* 13.8*  NEUTROABS 9.3* 12.9*  --   --  15.9* 15.0* PENDING  HGB 12.7 13.4 11.6* 12.2 13.8 13.6 12.8  HCT 38.3 40.3 34.0* 36.0 41.8 42.5 38.8  MCV 92.3 91.0  --   --  90.1 91.6 91.3  PLT 175 262  --   --  352 329 246   Basic Metabolic Panel: Recent Labs  Lab 01/21/19 0915  01/22/19 0110 01/23/19 0445 01/23/19 0818 01/24/19 0530 01/25/19 0500 01/25/19 1552  NA 142   < > 144 149* 150* 150* 151*  --   K 4.5   < > 4.2 5.0 3.8 3.3* 3.6  --   CL 113*  --  117* 121*  --  115* 116*  --  CO2 16*  --  17* 14*  --  18* 19*  --   GLUCOSE 120*  --  142* 146*  --  174* 240*  --   BUN 25*  --  31* 33*  --  36* 41*  --   CREATININE 0.76  --  0.78 0.75  --  0.94 0.94  --   CALCIUM 8.2*  --  8.4* 8.4*  --  8.4* 8.1*  --   MG  --   --   --   --   --  2.3 2.5* 2.5*  PHOS  --   --   --   --   --   --  2.9 2.7   < > = values in this interval not displayed.   GFR: Estimated Creatinine Clearance: 39.5 mL/min (by C-G formula based on SCr of 0.94 mg/dL). Liver Function Tests: Recent Labs  Lab 01/21/19 0915 01/22/19 0110 01/23/19 0445 01/24/19 0530 01/25/19 0500  AST 66* 61* 64* 38 32  ALT 48* 46* 46* 40 35  ALKPHOS 84 96 126 123 118  BILITOT 0.6 0.3 0.8 0.9 0.7  PROT 6.2* 6.1* 6.3* 6.6 6.2*  ALBUMIN 2.7* 2.7* 2.7* 2.9* 2.8*   No results for input(s): LIPASE, AMYLASE in the last 168 hours. No results for input(s): AMMONIA in the last 168 hours. Coagulation Profile: No results for input(s): INR, PROTIME in the last 168 hours. Cardiac Enzymes: No results for input(s): CKTOTAL, CKMB, CKMBINDEX, TROPONINI in the last 168 hours. BNP (last 3 results) No results for input(s): PROBNP in the last 8760  hours. HbA1C: No results for input(s): HGBA1C in the last 72 hours. CBG: Recent Labs  Lab 01/25/19 1236 01/25/19 1613 01/25/19 2342 01/26/19 0345 01/26/19 0732  GLUCAP 215* 229* 237* 196* 227*   Lipid Profile: No results for input(s): CHOL, HDL, LDLCALC, TRIG, CHOLHDL, LDLDIRECT in the last 72 hours. Thyroid Function Tests: Recent Labs    01/24/19 0530  TSH 0.243*   Anemia Panel: No results for input(s): VITAMINB12, FOLATE, FERRITIN, TIBC, IRON, RETICCTPCT in the last 72 hours. Urine analysis: No results found for: COLORURINE, APPEARANCEUR, LABSPEC, PHURINE, GLUCOSEU, HGBUR, BILIRUBINUR, KETONESUR, PROTEINUR, UROBILINOGEN, NITRITE, LEUKOCYTESUR Sepsis Labs: (procalcitonin:4,lacticidven:4)  ) Recent Results (from the past 240 hour(s))  Blood Culture (routine x 2)     Status: None   Collection Time: 01/18/2019  7:34 PM   Specimen: BLOOD  Result Value Ref Range Status   Specimen Description   Final    BLOOD LEFT ANTECUBITAL Performed at [facility], [address] [place], [place] [address]    Special Requests   Final    BOTTLES DRAWN AEROBIC AND ANAEROBIC Blood Culture adequate volume Performed at [facility], [address], [place], [place] [address]    Culture   Final    NO GROWTH 5 DAYS Performed at [facility], [address], [place], [place] [address]    Report Status 01/24/2019 FINAL  Final  Blood Culture (routine x 2)     Status: None   Collection Time: 01/16/2019  7:34 PM   Specimen: BLOOD LEFT FOREARM  Result Value Ref Range Status   Specimen Description   Final    BLOOD LEFT FOREARM Performed at [facility], [address] [place], [place] [address]    Special Requests   Final    BOTTLES DRAWN AEROBIC AND ANAEROBIC Blood Culture results may not be optimal due to an inadequate volume of  blood received in culture bottles Performed at [facility], [address]  [place], [place] [address]    Culture   Final    NO GROWTH 5 DAYS Performed at [facility], [address] [place], [place] [address]    Report Status 01/24/2019 FINAL  Final  SARS CORONAVIRUS 2 (TAT 6-24 HRS) Nasopharyngeal Nasopharyngeal Swab     Status: Abnormal   Collection Time: 02-10-2019 11:44 PM   Specimen: Nasopharyngeal Swab  Result Value Ref Range Status   SARS Coronavirus 2 POSITIVE (A) NEGATIVE Final    Comment: RESULT CALLED TO, READ BACK BY AND VERIFIED WITH: Cyndie Mull RN 9:40 01/20/19 ([email or web address]) (NOTE) SARS-CoV-2 target nucleic acids are DETECTED. The SARS-CoV-2 RNA is generally detectable in upper and lower respiratory specimens during the acute phase of infection. Positive results are indicative of active infection with SARS-CoV-2. Clinical  correlation with patient history and other diagnostic information is necessary to determine patient infection status. Positive results do  not rule out bacterial infection or co-infection with other viruses. The expected result is Negative. Fact Sheet for Patients: HairSlick.no Fact Sheet for Healthcare Providers: quierodirigir.com This test is not yet approved or cleared by the [place] FDA and  has been authorized for detection and/or diagnosis of SARS-CoV-2 by FDA under an Emergency Use Authorization (EUA). This EUA will remain  in effect (meaning this test can be used) for  the duration of the COVID-19 declaration under Section 564(b)(1) of the Act, 21 U.S.C. section 360bbb-3(b)(1), unless the authorization is terminated or revoked sooner. Performed at [facility], [address], [place], [place] [address]   MRSA PCR Screening     Status: None   Collection Time: 01/22/19  2:57 AM   Specimen: Nasal Mucosa; Nasopharyngeal  Result Value Ref Range Status   MRSA by PCR NEGATIVE NEGATIVE Final    Comment:        The GeneXpert MRSA Assay (FDA approved for  NASAL specimens only), is one component of a comprehensive MRSA colonization surveillance program. It is not intended to diagnose MRSA infection nor to guide or monitor treatment for MRSA infections. Performed at [facility], [address] [place], [place] [address]          Radiology Studies: No results found.      Scheduled Meds:  Chlorhexidine Gluconate Cloth  6 each Topical Daily   clonazepam  0.5 mg Oral BID   cloNIDine  0.2 mg Transdermal Q Mon   enoxaparin (LOVENOX) injection  40 mg Subcutaneous BID   fluticasone furoate-vilanterol  1 puff Inhalation Daily   magic mouthwash w/lidocaine  5 mL Oral QID   mouth rinse  15 mL Mouth Rinse BID   methylPREDNISolone (SOLU-MEDROL) injection  60 mg Intravenous Q12H   nystatin  5 mL Oral QID   pantoprazole (PROTONIX) IV  40 mg Intravenous Q24H   sodium polystyrene  15 g Oral Once   vitamin C  500 mg Oral Daily   zinc sulfate  220 mg Oral Daily   Continuous Infusions:  ampicillin-sulbactam (UNASYN) IV 200 mL/hr at 01/26/19 0800   dextrose Stopped (01/26/19 0742)   diltiazem (CARDIZEM) infusion 15 mg/hr (01/26/19 0800)   feeding supplement (GLUCERNA 1.2 CAL) 1,000 mL (01/25/19 1630)     LOS: 7 days   The patient is critically ill with multiple organ systems failure and requires high complexity decision making for assessment and support, frequent evaluation and titration of therapies, application of  advanced monitoring technologies and extensive interpretation of multiple databases. Critical Care Time devoted to patient care services described in this note  Time spent: 40 minutes     Domini Vandehei, Roselind MessierURTIS J, MD [facility] Pager [phone]  If 7PM-7AM, please contact night-coverage [email or web address] Password [email or web address] 01/26/2019, 8:50 AM

## 2019-01-26 NOTE — Progress Notes (Signed)
Per order instruction,  MD was notified about CBG greater than 160. No further orders were given by MD on call.

## 2019-01-27 ENCOUNTER — Inpatient Hospital Stay (HOSPITAL_COMMUNITY): Payer: Medicare Other

## 2019-01-27 DIAGNOSIS — J431 Panlobular emphysema: Secondary | ICD-10-CM | POA: Diagnosis not present

## 2019-01-27 DIAGNOSIS — U071 COVID-19: Secondary | ICD-10-CM | POA: Diagnosis not present

## 2019-01-27 DIAGNOSIS — J9601 Acute respiratory failure with hypoxia: Secondary | ICD-10-CM | POA: Diagnosis not present

## 2019-01-27 DIAGNOSIS — E7800 Pure hypercholesterolemia, unspecified: Secondary | ICD-10-CM | POA: Diagnosis not present

## 2019-01-27 LAB — COMPREHENSIVE METABOLIC PANEL
ALT: 30 U/L (ref 0–44)
AST: 31 U/L (ref 15–41)
Albumin: 2.9 g/dL — ABNORMAL LOW (ref 3.5–5.0)
Alkaline Phosphatase: 125 U/L (ref 38–126)
Anion gap: 16 — ABNORMAL HIGH (ref 5–15)
BUN: 36 mg/dL — ABNORMAL HIGH (ref 8–23)
CO2: 18 mmol/L — ABNORMAL LOW (ref 22–32)
Calcium: 8.3 mg/dL — ABNORMAL LOW (ref 8.9–10.3)
Chloride: 117 mmol/L — ABNORMAL HIGH (ref 98–111)
Creatinine, Ser: 0.85 mg/dL (ref 0.44–1.00)
GFR calc Af Amer: >60 mL/min (ref >60)
GFR calc non Af Amer: >60 mL/min (ref >60)
Glucose, Bld: 234 mg/dL — ABNORMAL HIGH (ref 70–99)
Potassium: 4 mmol/L (ref 3.5–5.1)
Sodium: 151 mmol/L — ABNORMAL HIGH (ref 135–145)
Total Bilirubin: 0.9 mg/dL (ref 0.3–1.2)
Total Protein: 5.8 g/dL — ABNORMAL LOW (ref 6.5–8.1)

## 2019-01-27 LAB — CBC WITH DIFFERENTIAL/PLATELET
Abs Immature Granulocytes: 0.15 10*3/uL — ABNORMAL HIGH (ref 0.00–0.07)
Basophils Absolute: 0 10*3/uL (ref 0.0–0.1)
Basophils Relative: 0 %
Eosinophils Absolute: 0 10*3/uL (ref 0.0–0.5)
Eosinophils Relative: 0 %
HCT: 38.8 % (ref 36.0–46.0)
Hemoglobin: 12.8 g/dL (ref 12.0–15.0)
Immature Granulocytes: 1 %
Lymphocytes Relative: 2 %
Lymphs Abs: 0.3 10*3/uL — ABNORMAL LOW (ref 0.7–4.0)
MCH: 30.3 pg (ref 26.0–34.0)
MCHC: 33 g/dL (ref 30.0–36.0)
MCV: 91.7 fL (ref 80.0–100.0)
Monocytes Absolute: 0.5 10*3/uL (ref 0.1–1.0)
Monocytes Relative: 3 %
Neutro Abs: 12.7 10*3/uL — ABNORMAL HIGH (ref 1.7–7.7)
Neutrophils Relative %: 94 %
Platelets: 210 10*3/uL (ref 150–400)
RBC: 4.23 MIL/uL (ref 3.87–5.11)
RDW: 15.1 % (ref 11.5–15.5)
WBC: 13.6 10*3/uL — ABNORMAL HIGH (ref 4.0–10.5)
nRBC: 0.1 % (ref 0.0–0.2)

## 2019-01-27 LAB — GLUCOSE, CAPILLARY
Glucose-Capillary: 207 mg/dL — ABNORMAL HIGH (ref 70–99)
Glucose-Capillary: 264 mg/dL — ABNORMAL HIGH (ref 70–99)
Glucose-Capillary: 260 mg/dL — ABNORMAL HIGH (ref 70–99)
Glucose-Capillary: 235 mg/dL — ABNORMAL HIGH (ref 70–99)
Glucose-Capillary: 178 mg/dL — ABNORMAL HIGH (ref 70–99)

## 2019-01-27 LAB — D-DIMER, QUANTITATIVE: D-Dimer, Quant: 1.81 ug/mL-FEU — ABNORMAL HIGH (ref 0.00–0.50)

## 2019-01-27 LAB — PHOSPHORUS: Phosphorus: 2.5 mg/dL (ref 2.5–4.6)

## 2019-01-27 LAB — PROCALCITONIN: Procalcitonin: 0.18 ng/mL

## 2019-01-27 LAB — C-REACTIVE PROTEIN: CRP: 0.8 mg/dL (ref <1.0)

## 2019-01-27 LAB — HEMOGLOBIN A1C
Hgb A1c MFr Bld: 6.5 % — ABNORMAL HIGH (ref 4.8–5.6)
Mean Plasma Glucose: 139.85 mg/dL

## 2019-01-27 LAB — MAGNESIUM
Magnesium: 2.7 mg/dL — ABNORMAL HIGH (ref 1.7–2.4)
Magnesium: 2.8 mg/dL — ABNORMAL HIGH (ref 1.7–2.4)

## 2019-01-27 MED ORDER — FREE WATER
200.0000 mL | Freq: Three times a day (TID) | Status: DC
  Administered 2019-01-27 – 2019-01-29 (×8): 200 mL

## 2019-01-27 MED ORDER — ENOXAPARIN SODIUM 40 MG/0.4ML ~~LOC~~ SOLN
40.0000 mg | SUBCUTANEOUS | Status: DC
  Administered 2019-01-28 – 2019-01-29 (×2): 40 mg via SUBCUTANEOUS
  Filled 2019-01-27 (×3): qty 0.4

## 2019-01-27 MED ORDER — INSULIN ASPART 100 UNIT/ML ~~LOC~~ SOLN
0.0000 [IU] | SUBCUTANEOUS | Status: DC
  Administered 2019-01-27: 5 [IU] via SUBCUTANEOUS
  Administered 2019-01-27: 8 [IU] via SUBCUTANEOUS
  Administered 2019-01-27 – 2019-01-28 (×2): 3 [IU] via SUBCUTANEOUS
  Administered 2019-01-28: 5 [IU] via SUBCUTANEOUS
  Administered 2019-01-28 (×3): 3 [IU] via SUBCUTANEOUS
  Administered 2019-01-28 – 2019-01-29 (×2): 5 [IU] via SUBCUTANEOUS
  Administered 2019-01-29 (×2): 3 [IU] via SUBCUTANEOUS
  Administered 2019-01-29: 08:00:00 8 [IU] via SUBCUTANEOUS

## 2019-01-27 NOTE — Progress Notes (Signed)
Ms. Rachelle Hora is lying in bed comfortably.  Mildly tachypneic, but no accessory muscle use.  Breathing appears nonlabored.  Titrate oxygen to sats of 88 to 92% and comfort.  Julian Hy, DO 01/27/19 5:41 PM [facility]

## 2019-01-27 NOTE — Progress Notes (Signed)
SLP Cancellation Note  Patient Details Name: Ashley Cook MRN: [id number] DOB: May 07, 1945   Cancelled treatment:       Reason Eval/Treat Not Completed: Patient not medically ready;Patient's level of consciousness. Will continue to follow for readiness  Herbie [place], [place] Pager [phone] Office [phone]  Lynann Beaver 01/27/2019, 12:26 PM

## 2019-01-27 NOTE — Progress Notes (Signed)
OT Cancellation Note  Patient Details Name: Ashley Cook MRN: [id number] DOB: [birth year]   Cancelled Treatment:    Reason Eval/Treat Not Completed: Medical issues which prohibited therapy  [name],[place] 01/27/2019, 2:21 PM  Maurie Boettcher, OT/L   Acute OT Clinical Specialist Acute Rehabilitation Services Pager [phone] Office [phone]

## 2019-01-27 NOTE — Progress Notes (Signed)
Patient has increased tachypnea, air hunger, and sats dropping. Morphine given for signs of discomfort.

## 2019-01-27 NOTE — Progress Notes (Addendum)
PROGRESS NOTE    Ashley Cook  ZOX:[id number] DOB: 1945/12/20 DOA: 02/16/19 PCP: Koren Shiver, DO   Brief Narrative:  73 year old female PMHx    presented with dyspnea and cough. She does have significant past medical history of asthma, and COPD. She tested positive for COVID-19 November 15, she has been symptomatic with dyspnea, cough, fatigue and body aches, she had the symptoms for 3 to 4 days and when she became extremely short of breath she came to the ER where she was diagnosed with acute hypoxic respiratory failure secondary to COVID-19 pneumonia she was placed on 4 L oxygen and was started on appropriate treatment and sent to [facility].   Subjective: 11/27 last 24 hours afebrile.  Somnolent.  Per RN received morphine secondary to discomfort with respiration.   Assessment & Plan:   Principal Problem:   Pneumonia due to COVID-19 virus Active Problems:   Acute respiratory failure with hypoxia (HCC)   HLD (hyperlipidemia)   COPD (chronic obstructive pulmonary disease) (HCC)   COVID-19  Covid pneumonia/acute respiratory failure with hypoxia COVID-19 Labs  Recent Labs    01/25/19 0500 01/26/19 0735 01/27/19 0504 01/27/19 0505  DDIMER 1.49* 1.73* 1.81*  --   CRP 1.3* <0.8  --  0.8    Lab Results  Component Value Date   SARSCOV2NAA POSITIVE (A) 2019/02/16   SARSCOV2NAA Not Detected 01/11/2019  -she has severe disease and unfortunately she waited at home for several days with shortness of breath before presenting to the hospital -Completed Remdesivir per pharmacy -Solu-Medrol 60 mg BID -11/21 Covid convalescent plasma -11/22 Actemra x1 -Titrate O2 to maintain SPO2> 88% -Combivent if patient can cooperate -Flutter valve if patient can cooperate -Incentive spirometry if patient can cooperate -11/27 PCXR; slight improvement left lung base see results below -Prone patient 16 hours/day; if patient cannot tolerate prone 2 to 3 hours per shift -Morphine PRN  air hunger -Ativan PRN anxiety  Aspiration pneumonia -Patient most likely microaspirating -Continue Unasyn x7 days -Trend procalcitonin  COPD -See Covid pneumonia  Hypernatremia -11/25 Albumin 25 g.  Administer prior to starting D5W -11/25 D5W 50 ml/hr   -11/27 increase free water 200 ml TID  Hypokalemia -Potassium goal> 4  A. Fib -11/26 currently NSR -11/26 DC IV Cardizem -11/26 Cardizem p.o 60 mg QID.   Elevated proBNP -Intravascularly dehydrated with third spacing -Hold Lasix. -See hypernatremia  Metabolic encephalopathy -Correct underlying disorder -Hold all sedating medication  HLD -Restart statin once CoreTtrak placed  Moderate protein calorie malnutrition -Elevate head of bed> 30 degrees.  Aspiration precautions -Tube feeds Glucerna 1.2 Cal 50 ml/hr   Hyperglycemia -Hemoglobin A1c pending -Moderate SSI    DVT prophylaxis: Lovenox Code Status: DNR Family Communication: 11/27 spoke with Alvino Chapel (daughter) and husband explained plan of care answered all questions.    Disposition Plan: TBD   Consultants:  PCCM   Procedures/Significant Events:  11/21 Covid convalescent plasma 11/22 Actemra x1 11/27 PCXR;.-No change in the diffuse interstitial infiltrate in the right Lung. -Slight improvement in the interstitial infiltrate at the left lung base.    I have personally reviewed and interpreted all radiology studies and my findings are as above.  VENTILATOR SETTINGS: HFNC+NR 11/27 Flow; 30 L/min FiO2; 90% SPO2; 94%   Cultures   Antimicrobials: Anti-infectives (From admission, onward)   Start     Stop   01/23/19 1100  Ampicillin-Sulbactam (UNASYN) 3 g in sodium chloride 0.9 % 100 mL IVPB         01/21/19 1000  remdesivir 100 mg in sodium chloride 0.9 % 250 mL IVPB  Status:  Discontinued     01/20/19 1015   01/21/19 1000  azithromycin (ZITHROMAX) tablet 500 mg  Status:  Discontinued     01/23/19 1028   01/20/19 1800  fluconazole (DIFLUCAN)  tablet 150 mg     01/20/19 1828   01/20/19 1600  remdesivir 100 mg in sodium chloride 0.9 % 250 mL IVPB     01/23/19 1201   01/20/19 0015  remdesivir 200 mg in sodium chloride 0.9 % 250 mL IVPB     01/20/19 0141   January 20, 2019 2045  cefTRIAXone (ROCEPHIN) 2 g in sodium chloride 0.9 % 100 mL IVPB  Status:  Discontinued     01/23/19 1028   January 20, 2019 2045  azithromycin (ZITHROMAX) 500 mg in sodium chloride 0.9 % 250 mL IVPB  Status:  Discontinued     01/21/19 0925       Devices    LINES / TUBES:      Continuous Infusions: . ampicillin-sulbactam (UNASYN) IV 3 g (01/27/19 0835)  . dextrose 75 mL/hr at 01/27/19 0458  . feeding supplement (GLUCERNA 1.2 CAL) 1,000 mL (01/25/19 1630)     Objective: Vitals:   01/27/19 0400 01/27/19 0500 01/27/19 0600 01/27/19 0751  BP: (!) 147/39 (!) 140/49 (!) 133/47 (!) 145/54  Pulse: 77 77 79 76  Resp: (!) 25 (!) 27 (!) 22 (!) 23  Temp:      TempSrc:      SpO2: 94% 93% 95% 94%  Weight:  66.9 kg    Height:        Intake/Output Summary (Last 24 hours) at 01/27/2019 0859 Last data filed at 01/27/2019 4097 Gross per 24 hour  Intake 2682.38 ml  Output 1230 ml  Net 1452.38 ml   Filed Weights   20-Jan-2019 1910 01/27/19 0500  Weight: 47.6 kg 66.9 kg   Physical Exam:  General: Somnolent (just received morphine), positive acute respiratory distress Eyes: negative scleral hemorrhage, negative anisocoria, negative icterus ENT: Negative Runny nose, negative gingival bleeding, Neck:  Negative scars, masses, torticollis, lymphadenopathy, JVD Lungs: Tachypnea, using accessory muscles (belly breathing), diffuse decreased breath sounds, without wheezes or crackles Cardiovascular: Regular rate and rhythm without murmur gallop or rub normal S1 and S2 Abdomen: negative abdominal pain, nondistended, positive soft, bowel sounds, no rebound, no ascites, no appreciable mass Extremities: No significant cyanosis, clubbing, or edema bilateral lower extremities  Skin: Negative rashes, lesions, ulcers Psychiatric: Unable to assess secondary to altered mental status Central nervous system: Unable to assess secondary to altered mental status    .     Data Reviewed: Care during the described time interval was provided by me .  I have reviewed this patient's available data, including medical history, events of note, physical examination, and all test results as part of my evaluation.   CBC: Recent Labs  Lab 01/21/19 0915  01/23/19 0818 01/24/19 0530 01/25/19 0500 01/26/19 0735 01/27/19 0504  WBC 15.1*  --   --  17.7* 16.4* 13.8* 13.6*  NEUTROABS 12.9*  --   --  15.9* 15.0* 12.9* 12.7*  HGB 13.4   < > 12.2 13.8 13.6 12.8 12.8  HCT 40.3   < > 36.0 41.8 42.5 38.8 38.8  MCV 91.0  --   --  90.1 91.6 91.3 91.7  PLT 262  --   --  352 329 246 210   < > = values in this interval not displayed.   Basic Metabolic  Panel: Recent Labs  Lab 01/23/19 0445 01/23/19 0818  01/24/19 0530 01/25/19 0500 01/25/19 1552 01/26/19 0735 01/26/19 1650 01/27/19 0504  NA 149* 150*  --  150* 151*  --  148*  --  151*  K 5.0 3.8  --  3.3* 3.6  --  3.5  --  4.0  CL 121*  --   --  115* 116*  --  114*  --  117*  CO2 14*  --   --  18* 19*  --  20*  --  18*  GLUCOSE 146*  --   --  174* 240*  --  227*  --  234*  BUN 33*  --   --  36* 41*  --  37*  --  36*  CREATININE 0.75  --   --  0.94 0.94  --  0.91  --  0.85  CALCIUM 8.4*  --   --  8.4* 8.1*  --  8.1*  --  8.3*  MG  --   --    < > 2.3 2.5* 2.5* 2.5* 2.7* 2.7*  PHOS  --   --   --   --  2.9 2.7 2.7  --  2.5   < > = values in this interval not displayed.   GFR: Estimated Creatinine Clearance: 53.4 mL/min (by C-G formula based on SCr of 0.85 mg/dL). Liver Function Tests: Recent Labs  Lab 01/23/19 0445 01/24/19 0530 01/25/19 0500 01/26/19 0735 01/27/19 0504  AST 64* 38 32 25 31  ALT 46* 40 35 29 30  ALKPHOS 126 123 118 132* 125  BILITOT 0.8 0.9 0.7 0.8 0.9  PROT 6.3* 6.6 6.2* 6.0* 5.8*  ALBUMIN 2.7*  2.9* 2.8* 3.0* 2.9*   No results for input(s): LIPASE, AMYLASE in the last 168 hours. No results for input(s): AMMONIA in the last 168 hours. Coagulation Profile: No results for input(s): INR, PROTIME in the last 168 hours. Cardiac Enzymes: No results for input(s): CKTOTAL, CKMB, CKMBINDEX, TROPONINI in the last 168 hours. BNP (last 3 results) No results for input(s): PROBNP in the last 8760 hours. HbA1C: No results for input(s): HGBA1C in the last 72 hours. CBG: Recent Labs  Lab 01/26/19 1245 01/26/19 1649 01/26/19 2043 01/26/19 2334 01/27/19 0312  GLUCAP 206* 229* 235* 219* 207*   Lipid Profile: No results for input(s): CHOL, HDL, LDLCALC, TRIG, CHOLHDL, LDLDIRECT in the last 72 hours. Thyroid Function Tests: No results for input(s): TSH, T4TOTAL, FREET4, T3FREE, THYROIDAB in the last 72 hours. Anemia Panel: No results for input(s): VITAMINB12, FOLATE, FERRITIN, TIBC, IRON, RETICCTPCT in the last 72 hours. Urine analysis: No results found for: COLORURINE, APPEARANCEUR, LABSPEC, PHURINE, GLUCOSEU, HGBUR, BILIRUBINUR, KETONESUR, PROTEINUR, UROBILINOGEN, NITRITE, LEUKOCYTESUR Sepsis Labs: @LABRCNTIP (procalcitonin:4,lacticidven:4)  ) Recent Results (from the past 240 hour(s))  Blood Culture (routine x 2)     Status: None   Collection Time: 06-14-2018  7:34 PM   Specimen: BLOOD  Result Value Ref Range Status   Specimen Description   Final    BLOOD LEFT ANTECUBITAL Performed at [facility], [address] [place], [place] [address]    Special Requests   Final    BOTTLES DRAWN AEROBIC AND ANAEROBIC Blood Culture adequate volume Performed at [facility], [address] [place], [place] [address]    Culture   Final    NO GROWTH 5 DAYS Performed at [facility], [address] [place], [place] [address]    Report Status 01/24/2019  FINAL  Final  Blood Culture (routine x 2)     Status: None   Collection Time: February 17, 2019  7:34 PM    Specimen: BLOOD LEFT FOREARM  Result Value Ref Range Status   Specimen Description   Final    BLOOD LEFT FOREARM Performed at [facility], [address], [place], [place] [address]    Special Requests   Final    BOTTLES DRAWN AEROBIC AND ANAEROBIC Blood Culture results may not be optimal due to an inadequate volume of blood received in culture bottles Performed at [facility], [address] [place], [place] [address]    Culture   Final    NO GROWTH 5 DAYS Performed at [facility], [address], [place], [place] [address]    Report Status 01/24/2019 FINAL  Final  SARS CORONAVIRUS 2 (TAT 6-24 HRS) Nasopharyngeal Nasopharyngeal Swab     Status: Abnormal   Collection Time: 02-17-2019 11:44 PM   Specimen: Nasopharyngeal Swab  Result Value Ref Range Status   SARS Coronavirus 2 POSITIVE (A) NEGATIVE Final    Comment: RESULT CALLED TO, READ BACK BY AND VERIFIED WITH: Cyndie Mull RN 9:40 01/20/19 ([email or web address]) (NOTE) SARS-CoV-2 target nucleic acids are DETECTED. The SARS-CoV-2 RNA is generally detectable in upper and lower respiratory specimens during the acute phase of infection. Positive results are indicative of active infection with SARS-CoV-2. Clinical  correlation with patient history and other diagnostic information is necessary to determine patient infection status. Positive results do  not rule out bacterial infection or co-infection with other viruses. The expected result is Negative. Fact Sheet for Patients: HairSlick.no Fact Sheet for Healthcare Providers: quierodirigir.com This test is not yet approved or cleared by the [place] FDA and  has been authorized for detection and/or diagnosis of SARS-CoV-2 by FDA under an Emergency Use Authorization (EUA). This EUA will remain  in effect (meaning this test can be used) for  the duration of the COVID-19 declaration under Section 564(b)(1)  of the Act, 21 U.S.C. section 360bbb-3(b)(1), unless the authorization is terminated or revoked sooner. Performed at [facility], [address] [place], [place] [address]   MRSA PCR Screening     Status: None   Collection Time: 01/22/19  2:57 AM   Specimen: Nasal Mucosa; Nasopharyngeal  Result Value Ref Range Status   MRSA by PCR NEGATIVE NEGATIVE Final    Comment:        The GeneXpert MRSA Assay (FDA approved for NASAL specimens only), is one component of a comprehensive MRSA colonization surveillance program. It is not intended to diagnose MRSA infection nor to guide or monitor treatment for MRSA infections. Performed at [facility], [address], [place], [place] [address]          Radiology Studies: No results found.      Scheduled Meds: . Chlorhexidine Gluconate Cloth  6 each Topical Daily  . clonazepam  0.5 mg Oral BID  . cloNIDine  0.2 mg Transdermal Q Mon  . diltiazem  60 mg Per Tube Q6H  . enoxaparin (LOVENOX) injection  40 mg Subcutaneous BID  . fluticasone furoate-vilanterol  1 puff Inhalation Daily  . free water  100 mL Per Tube Q8H  . magic mouthwash w/lidocaine  5 mL Oral QID  . mouth rinse  15 mL Mouth Rinse BID  . methylPREDNISolone (SOLU-MEDROL) injection  60 mg Intravenous Q12H  . nystatin  5 mL Oral QID  . pantoprazole (PROTONIX)  IV  40 mg Intravenous Q24H  . sodium polystyrene  15 g Oral Once  . vitamin C  500 mg Oral Daily  . zinc sulfate  220 mg Oral Daily   Continuous Infusions: . ampicillin-sulbactam (UNASYN) IV 3 g (01/27/19 0835)  . dextrose 75 mL/hr at 01/27/19 0458  . feeding supplement (GLUCERNA 1.2 CAL) 1,000 mL (01/25/19 1630)     LOS: 8 days   The patient is critically ill with multiple organ systems failure and requires high complexity decision making for assessment and support, frequent evaluation and titration of therapies, application of advanced monitoring technologies and extensive  interpretation of multiple databases. Critical Care Time devoted to patient care services described in this note  Time spent: 40 minutes     Batu Cassin, Roselind Messier, MD [facility] Pager [phone]  If 7PM-7AM, please contact night-coverage [email or web address] Password [email or web address] 01/27/2019, 8:59 AM

## 2019-01-27 NOTE — Progress Notes (Signed)
Pt can't follow any commands so ok to dc magic mouth wash per Dr. Sherral Hammers.  Onnie Boer, PharmD, BCIDP, AAHIVP, CPP Infectious Disease Pharmacist 01/27/2019 4:18 PM

## 2019-01-27 NOTE — Progress Notes (Signed)
PT Cancellation Note  Patient Details Name: Ashley Cook MRN: [id number] DOB: 1945/10/14   Cancelled Treatment:    Reason Eval/Treat Not Completed: Medical issues which prohibited therapy   Barry Brunner, PT Pager [phone]   Rexanne Mano 01/27/2019, 1:59 PM

## 2019-01-27 NOTE — Progress Notes (Signed)
Pharmacy Antibiotic Note  Ashley Cook is a 73 y.o. female admitted on 01-21-19 with COVID-19 pneumonia.  Pharmacy has been consulted for Unasyn dosing for aspiration pneumonia. WBC 13.6 on 11/27. Patient on steriods Afebrile. SCr wnl. Patient is day #5 of 7 of Unasyn per d/w MD   Plan: -Continue Unasyn 3 gm IV Q 6 hours for a total of 7 days per MD -Monitor renal fx and clinical progress   Height: 5\' 3"  (160 cm) Weight: 147 lb 8 oz (66.9 kg) IBW/kg (Calculated) : 52.4  Temp (24hrs), Avg:97.2 F (36.2 C), Min:96.8 F (36 C), Max:98.2 F (36.8 C)  Recent Labs  Lab 01/21/19 0915  01/23/19 0445 01/24/19 0530 01/25/19 0500 01/26/19 0735 01/27/19 0504  WBC 15.1*  --   --  17.7* 16.4* 13.8* 13.6*  CREATININE 0.76   < > 0.75 0.94 0.94 0.91 0.85   < > = values in this interval not displayed.    Estimated Creatinine Clearance: 53.4 mL/min (by C-G formula based on SCr of 0.85 mg/dL).    No Known Allergies    Gussie Murton A. Levada Dy, PharmD, BCPS, FNKF Clinical Pharmacist [place] Please utilize Amion for appropriate phone number to reach the unit pharmacist ([place])

## 2019-01-28 DIAGNOSIS — J690 Pneumonitis due to inhalation of food and vomit: Secondary | ICD-10-CM | POA: Diagnosis not present

## 2019-01-28 DIAGNOSIS — R7303 Prediabetes: Secondary | ICD-10-CM

## 2019-01-28 DIAGNOSIS — I4891 Unspecified atrial fibrillation: Secondary | ICD-10-CM | POA: Diagnosis present

## 2019-01-28 DIAGNOSIS — G92 Toxic encephalopathy: Secondary | ICD-10-CM

## 2019-01-28 DIAGNOSIS — J9601 Acute respiratory failure with hypoxia: Secondary | ICD-10-CM | POA: Diagnosis not present

## 2019-01-28 DIAGNOSIS — U071 COVID-19: Secondary | ICD-10-CM | POA: Diagnosis not present

## 2019-01-28 DIAGNOSIS — E440 Moderate protein-calorie malnutrition: Secondary | ICD-10-CM | POA: Diagnosis present

## 2019-01-28 DIAGNOSIS — G928 Other toxic encephalopathy: Secondary | ICD-10-CM | POA: Diagnosis present

## 2019-01-28 DIAGNOSIS — J431 Panlobular emphysema: Secondary | ICD-10-CM | POA: Diagnosis not present

## 2019-01-28 LAB — CBC WITH DIFFERENTIAL/PLATELET
Abs Immature Granulocytes: 0.07 10*3/uL (ref 0.00–0.07)
Basophils Absolute: 0 10*3/uL (ref 0.0–0.1)
Basophils Relative: 0 %
Eosinophils Absolute: 0 10*3/uL (ref 0.0–0.5)
Eosinophils Relative: 0 %
HCT: 40.3 % (ref 36.0–46.0)
Hemoglobin: 13.2 g/dL (ref 12.0–15.0)
Immature Granulocytes: 1 %
Lymphocytes Relative: 2 %
Lymphs Abs: 0.3 10*3/uL — ABNORMAL LOW (ref 0.7–4.0)
MCH: 30.1 pg (ref 26.0–34.0)
MCHC: 32.8 g/dL (ref 30.0–36.0)
MCV: 91.8 fL (ref 80.0–100.0)
Monocytes Absolute: 0.5 10*3/uL (ref 0.1–1.0)
Monocytes Relative: 4 %
Neutro Abs: 10.4 10*3/uL — ABNORMAL HIGH (ref 1.7–7.7)
Neutrophils Relative %: 93 %
Platelets: 177 10*3/uL (ref 150–400)
RBC: 4.39 MIL/uL (ref 3.87–5.11)
RDW: 14.9 % (ref 11.5–15.5)
WBC: 11.2 10*3/uL — ABNORMAL HIGH (ref 4.0–10.5)
nRBC: 0.2 % (ref 0.0–0.2)

## 2019-01-28 LAB — COMPREHENSIVE METABOLIC PANEL
ALT: 46 U/L — ABNORMAL HIGH (ref 0–44)
AST: 27 U/L (ref 15–41)
Albumin: 2.7 g/dL — ABNORMAL LOW (ref 3.5–5.0)
Alkaline Phosphatase: 136 U/L — ABNORMAL HIGH (ref 38–126)
Anion gap: 12 (ref 5–15)
BUN: 35 mg/dL — ABNORMAL HIGH (ref 8–23)
CO2: 21 mmol/L — ABNORMAL LOW (ref 22–32)
Calcium: 8.2 mg/dL — ABNORMAL LOW (ref 8.9–10.3)
Chloride: 116 mmol/L — ABNORMAL HIGH (ref 98–111)
Creatinine, Ser: 0.73 mg/dL (ref 0.44–1.00)
GFR calc Af Amer: >60 mL/min (ref >60)
GFR calc non Af Amer: >60 mL/min (ref >60)
Glucose, Bld: 200 mg/dL — ABNORMAL HIGH (ref 70–99)
Potassium: 3.5 mmol/L (ref 3.5–5.1)
Sodium: 149 mmol/L — ABNORMAL HIGH (ref 135–145)
Total Bilirubin: 0.6 mg/dL (ref 0.3–1.2)
Total Protein: 5.6 g/dL — ABNORMAL LOW (ref 6.5–8.1)

## 2019-01-28 LAB — MAGNESIUM: Magnesium: 2.7 mg/dL — ABNORMAL HIGH (ref 1.7–2.4)

## 2019-01-28 LAB — HEMOGLOBIN A1C
Hgb A1c MFr Bld: 6.7 % — ABNORMAL HIGH (ref 4.8–5.6)
Mean Plasma Glucose: 145.59 mg/dL

## 2019-01-28 LAB — C-REACTIVE PROTEIN: CRP: 0.8 mg/dL (ref <1.0)

## 2019-01-28 LAB — GLUCOSE, CAPILLARY
Glucose-Capillary: 169 mg/dL — ABNORMAL HIGH (ref 70–99)
Glucose-Capillary: 178 mg/dL — ABNORMAL HIGH (ref 70–99)
Glucose-Capillary: 183 mg/dL — ABNORMAL HIGH (ref 70–99)
Glucose-Capillary: 192 mg/dL — ABNORMAL HIGH (ref 70–99)
Glucose-Capillary: 206 mg/dL — ABNORMAL HIGH (ref 70–99)
Glucose-Capillary: 208 mg/dL — ABNORMAL HIGH (ref 70–99)
Glucose-Capillary: 226 mg/dL — ABNORMAL HIGH (ref 70–99)

## 2019-01-28 LAB — D-DIMER, QUANTITATIVE: D-Dimer, Quant: 1.9 ug/mL-FEU — ABNORMAL HIGH (ref 0.00–0.50)

## 2019-01-28 LAB — PHOSPHORUS: Phosphorus: 3.3 mg/dL (ref 2.5–4.6)

## 2019-01-28 MED ORDER — PANTOPRAZOLE SODIUM 40 MG PO TBEC
40.0000 mg | DELAYED_RELEASE_TABLET | Freq: Every day | ORAL | Status: DC
  Administered 2019-01-29: 11:00:00 40 mg via ORAL
  Filled 2019-01-28 (×2): qty 1

## 2019-01-28 NOTE — Progress Notes (Signed)
eLink Physician-Brief Progress Note Patient Name: [name] DOB: 21-Feb-1946 MRN: [id number]   Date of Service  01/28/2019  HPI/Events of Note  Bg > 200. On free water , sdoium now 149.   eICU Interventions  - decrease dextrose from 75 to 50 ml/hr for now.      Intervention Category Intermediate Interventions: Hyperglycemia - evaluation and treatment  Elmer Sow 01/28/2019, 11:31 PM

## 2019-01-28 NOTE — Progress Notes (Signed)
Spoke with patient's daughter Dyann Ruddle to update her on patient's status. All questions answered at this time.

## 2019-01-28 NOTE — Progress Notes (Signed)
Mrs. Rachelle Hora was primed prior to my evaluation due to hypoxia requiring a nonrebreather and high flow nasal cannula.  She appears comfortable, although nonresponsive-mental status consistent with previous exams.  Respirations appear controlled.   Question if she is an appropriate candidate for awake proning.  Do not recommend tube feeding in this position with an unprotected airway.  Please monitor closely.  Rest of care per primary.  Julian Hy, DO 01/28/19 5:40 PM [facility]

## 2019-01-28 NOTE — Progress Notes (Signed)
PROGRESS NOTE    Ashley Cook  YBO:[id number] DOB: 1945/09/14 DOA: 01/02/2019 PCP: Koren Shiver, DO   Brief Narrative:  73 year old female PMHx    presented with dyspnea and cough. She does have significant past medical history of asthma, and COPD. She tested positive for COVID-19 November 15, she has been symptomatic with dyspnea, cough, fatigue and body aches, she had the symptoms for 3 to 4 days and when she became extremely short of breath she came to the ER where she was diagnosed with acute hypoxic respiratory failure secondary to COVID-19 pneumonia she was placed on 4 L oxygen and was started on appropriate treatment and sent to [facility].   Subjective: 11/28 somnolent but answers yes and no questions.  Afebrile overnight.     Assessment & Plan:   Principal Problem:   Pneumonia due to COVID-19 virus Active Problems:   Acute respiratory failure with hypoxia (HCC)   HLD (hyperlipidemia)   COPD (chronic obstructive pulmonary disease) (HCC)   COVID-19   Prediabetes   Toxic metabolic encephalopathy   Unspecified atrial fibrillation (HCC)   Moderate protein-calorie malnutrition (HCC)   Aspiration pneumonia (HCC)  Covid pneumonia/acute respiratory failure with hypoxia COVID-19 Labs  Recent Labs    01/26/19 0735 01/27/19 0504 01/27/19 0505 01/28/19 0542  DDIMER 1.73* 1.81*  --  1.90*  CRP <0.8  --  0.8 0.8    Lab Results  Component Value Date   SARSCOV2NAA POSITIVE (A) 01/03/2019   SARSCOV2NAA Not Detected 01/11/2019  -she has severe disease and unfortunately she waited at home for several days with shortness of breath before presenting to the hospital -Completed Remdesivir per pharmacy -Solu-Medrol 60 mg BID -11/21 Covid convalescent plasma -11/22 Actemra x1 -Titrate O2 to maintain SPO2> 88% -Combivent if patient can cooperate -Flutter valve if patient can cooperate -Incentive spirometry if patient can cooperate -11/27 PCXR; slight improvement left  lung base see results below -Prone patient 16 hours/day; if patient cannot tolerate prone 2 to 3 hours per shift -Morphine PRN air hunger -Ativan PRN anxiety  Aspiration pneumonia -Patient most likely microaspirating -Continue Unasyn x7 days -Trend procalcitonin Results for Ashley, Cook (MRN [id number]) as of 01/28/2019 17:05  Ref. Range 01/23/2019 05:00 01/24/2019 05:30 01/25/2019 05:00 01/26/2019 07:35 01/27/2019 05:04  Procalcitonin Latest Units: ng/mL 0.24 <0.10 <0.10 0.10 0.18    COPD -See Covid pneumonia  Hypernatremia -11/25 Albumin 25 g.  Administer prior to starting D5W -11/25 D5W 50 ml/hr   -11/28 increase free water 200 ml QID    Hypokalemia -Potassium goal> 4  A. Fib -11/26 currently NSR -11/26 DC IV Cardizem -11/26 Cardizem p.o 60 mg QID.   Elevated proBNP -Intravascularly dehydrated with third spacing -Hold Lasix. -See hypernatremia  Toxic metabolic encephalopathy -Correct underlying disorder -Hold all sedating medication.  Except those used for proning patient  HLD -Restart statin once CoreTtrak placed  Moderate protein calorie malnutrition -Elevate head of bed> 30 degrees.  Aspiration precautions -Tube feeds Glucerna 1.2 Cal 50 ml/hr   Prediabetes vs diabetes type 2 controlled -11/28 hemoglobin A1c = 6.7  -Moderate SSI    DVT prophylaxis: Lovenox Code Status: DNR Family Communication: 11/28 spoke with Ashley Cook (daughter) and husband explained plan of care answered all questions.    Disposition Plan: TBD   Consultants:  PCCM   Procedures/Significant Events:  11/21 Covid convalescent plasma 11/22 Actemra x1 11/27 PCXR;.-No change in the diffuse interstitial infiltrate in the right Lung. -Slight improvement in the interstitial infiltrate at the left lung base.  I have personally reviewed and interpreted all radiology studies and my findings are as above.  VENTILATOR SETTINGS: HF [place] 11/28 Flow; 40 L/min FiO2; 70% SPO2; 89%    Cultures   Antimicrobials: Anti-infectives (From admission, onward)   Start     Stop   01/23/19 1100  Ampicillin-Sulbactam (UNASYN) 3 g in sodium chloride 0.9 % 100 mL IVPB         01/21/19 1000  remdesivir 100 mg in sodium chloride 0.9 % 250 mL IVPB  Status:  Discontinued     01/20/19 1015   01/21/19 1000  azithromycin (ZITHROMAX) tablet 500 mg  Status:  Discontinued     01/23/19 1028   01/20/19 1800  fluconazole (DIFLUCAN) tablet 150 mg     01/20/19 1828   01/20/19 1600  remdesivir 100 mg in sodium chloride 0.9 % 250 mL IVPB     01/23/19 1201   01/20/19 0015  remdesivir 200 mg in sodium chloride 0.9 % 250 mL IVPB     01/20/19 0141   2019-02-13 2045  cefTRIAXone (ROCEPHIN) 2 g in sodium chloride 0.9 % 100 mL IVPB  Status:  Discontinued     01/23/19 1028   02/13/2019 2045  azithromycin (ZITHROMAX) 500 mg in sodium chloride 0.9 % 250 mL IVPB  Status:  Discontinued     01/21/19 0925       Devices    LINES / TUBES:      Continuous Infusions: . ampicillin-sulbactam (UNASYN) IV Stopped (01/28/19 1414)  . dextrose 75 mL/hr at 01/28/19 1700  . feeding supplement (GLUCERNA 1.2 CAL) 30 mL/hr at 01/28/19 1700     Objective: Vitals:   01/28/19 1300 01/28/19 1400 01/28/19 1500 01/28/19 1600  BP: (!) 146/51 (!) 146/62 (!) 146/54 (!) 148/64  Pulse: 74 76 75 79  Resp: (!) 23 (!) 23 (!) 26 (!) 29  Temp:    (!) 94.8 F (34.9 C)  TempSrc:    Axillary  SpO2: 91% 97% 99% 97%  Weight:      Height:        Intake/Output Summary (Last 24 hours) at 01/28/2019 1738 Last data filed at 01/28/2019 1700 Gross per 24 hour  Intake 2813.77 ml  Output 175 ml  Net 2638.77 ml   Filed Weights   02-13-2019 1910 01/27/19 0500  Weight: 47.6 kg 66.9 kg  Physical Exam:  General: Somnolent but answers yes/no questions, positive acute respiratory distress, cachectic Eyes: negative scleral hemorrhage, negative anisocoria, negative icterus ENT: Negative Runny nose, negative gingival bleeding,  Neck:  Negative scars, masses, torticollis, lymphadenopathy, JVD Lungs: Tachypneic, diffuse decreased breath sounds, without wheezes or crackles Cardiovascular: Regular rate and rhythm without murmur gallop or rub normal S1 and S2 Abdomen: negative abdominal pain, nondistended, positive soft, bowel sounds, no rebound, no ascites, no appreciable mass Extremities: No significant cyanosis, clubbing, or edema bilateral lower extremities Skin: Negative rashes, lesions, ulcers Psychiatric: Unable to evaluate altered mental status Central nervous system: Unable to evaluate altered mental status    .     Data Reviewed: Care during the described time interval was provided by me .  I have reviewed this patient's available data, including medical history, events of note, physical examination, and all test results as part of my evaluation.   CBC: Recent Labs  Lab 01/24/19 0530 01/25/19 0500 01/26/19 0735 01/27/19 0504 01/28/19 0542  WBC 17.7* 16.4* 13.8* 13.6* 11.2*  NEUTROABS 15.9* 15.0* 12.9* 12.7* 10.4*  HGB 13.8 13.6 12.8 12.8 13.2  HCT 41.8 42.5  38.8 38.8 40.3  MCV 90.1 91.6 91.3 91.7 91.8  PLT 352 329 246 210 177   Basic Metabolic Panel: Recent Labs  Lab 01/24/19 0530 01/25/19 0500 01/25/19 1552 01/26/19 0735 01/26/19 1650 01/27/19 0504 01/27/19 1655 01/28/19 0542  NA 150* 151*  --  148*  --  151*  --  149*  K 3.3* 3.6  --  3.5  --  4.0  --  3.5  CL 115* 116*  --  114*  --  117*  --  116*  CO2 18* 19*  --  20*  --  18*  --  21*  GLUCOSE 174* 240*  --  227*  --  234*  --  200*  BUN 36* 41*  --  37*  --  36*  --  35*  CREATININE 0.94 0.94  --  0.91  --  0.85  --  0.73  CALCIUM 8.4* 8.1*  --  8.1*  --  8.3*  --  8.2*  MG 2.3 2.5* 2.5* 2.5* 2.7* 2.7* 2.8* 2.7*  PHOS  --  2.9 2.7 2.7  --  2.5  --  3.3   GFR: Estimated Creatinine Clearance: 56.7 mL/min (by C-G formula based on SCr of 0.73 mg/dL). Liver Function Tests: Recent Labs  Lab 01/24/19 0530 01/25/19 0500  01/26/19 0735 01/27/19 0504 01/28/19 0542  AST 38 32 ALT 40 35 29 30 46*  ALKPHOS 123 118 132* 125 136*  BILITOT 0.9 0.7 0.8 0.9 0.6  PROT 6.6 6.2* 6.0* 5.8* 5.6*  ALBUMIN 2.9* 2.8* 3.0* 2.9* 2.7*   No results for input(s): LIPASE, AMYLASE in the last 168 hours. No results for input(s): AMMONIA in the last 168 hours. Coagulation Profile: No results for input(s): INR, PROTIME in the last 168 hours. Cardiac Enzymes: No results for input(s): CKTOTAL, CKMB, CKMBINDEX, TROPONINI in the last 168 hours. BNP (last 3 results) No results for input(s): PROBNP in the last 8760 hours. HbA1C: Recent Labs    01/27/19 0504 01/28/19 0542  HGBA1C 6.5* 6.7*   CBG: Recent Labs  Lab 01/27/19 2354 01/28/19 0529 01/28/19 0847 01/28/19 1143 01/28/19 1610  GLUCAP 183* 206* 169* 178* 192*   Lipid Profile: No results for input(s): CHOL, HDL, LDLCALC, TRIG, CHOLHDL, LDLDIRECT in the last 72 hours. Thyroid Function Tests: No results for input(s): TSH, T4TOTAL, FREET4, T3FREE, THYROIDAB in the last 72 hours. Anemia Panel: No results for input(s): VITAMINB12, FOLATE, FERRITIN, TIBC, IRON, RETICCTPCT in the last 72 hours. Urine analysis: No results found for: COLORURINE, APPEARANCEUR, LABSPEC, PHURINE, GLUCOSEU, HGBUR, BILIRUBINUR, KETONESUR, PROTEINUR, UROBILINOGEN, NITRITE, LEUKOCYTESUR Sepsis Labs: (procalcitonin:4,lacticidven:4)  ) Recent Results (from the past 240 hour(s))  Blood Culture (routine x 2)     Status: None   Collection Time: 2019-01-31  7:34 PM   Specimen: BLOOD  Result Value Ref Range Status   Specimen Description   Final    BLOOD LEFT ANTECUBITAL Performed at [facility], [address], [place], [place] [address]    Special Requests   Final    BOTTLES DRAWN AEROBIC AND ANAEROBIC Blood Culture adequate volume Performed at [facility], [address] [place], [place] [address]    Culture   Final    NO GROWTH 5  DAYS Performed at [facility], [address] [place], [place] [address]    Report Status 01/24/2019 FINAL  Final  Blood Culture (routine x 2)     Status: None   Collection  Time: 01/11/2019  7:34 PM   Specimen: BLOOD LEFT FOREARM  Result Value Ref Range Status   Specimen Description   Final    BLOOD LEFT FOREARM Performed at [facility], [place] [address], [place], [place] [address]    Special Requests   Final    BOTTLES DRAWN AEROBIC AND ANAEROBIC Blood Culture results may not be optimal due to an inadequate volume of blood received in culture bottles Performed at [place] [address] [place], [place] [address]    Culture   Final    NO GROWTH 5 DAYS Performed at [facility], [place] [address], [place], [place] [address]    Report Status 01/24/2019 FINAL  Final  SARS CORONAVIRUS 2 (TAT 6-24 HRS) Nasopharyngeal Nasopharyngeal Swab     Status: Abnormal   Collection Time: 01/12/2019 11:44 PM   Specimen: Nasopharyngeal Swab  Result Value Ref Range Status   SARS Coronavirus 2 POSITIVE (A) NEGATIVE Final    Comment: RESULT CALLED TO, READ BACK BY AND VERIFIED WITH: Marisa Hua RN 9:40 01/20/19 ([email or web address]) (NOTE) SARS-CoV-2 target nucleic acids are DETECTED. The SARS-CoV-2 RNA is generally detectable in upper and lower respiratory specimens during the acute phase of infection. Positive results are indicative of active infection with SARS-CoV-2. Clinical  correlation with patient history and other diagnostic information is necessary to determine patient infection status. Positive results do  not rule out bacterial infection or co-infection with other viruses. The expected result is Negative. Fact Sheet for Patients: SugarRoll.be Fact Sheet for Healthcare Providers: https://www.woods-mathews.com/ This test is not yet approved or cleared by the [place] FDA and  has been authorized for detection and/or  diagnosis of SARS-CoV-2 by FDA under an Emergency Use Authorization (EUA). This EUA will remain  in effect (meaning this test can be used) for  the duration of the COVID-19 declaration under Section 564(b)(1) of the Act, 21 U.S.C. section 360bbb-3(b)(1), unless the authorization is terminated or revoked sooner. Performed at [facility], [place] [address], [place], [place] [address]   MRSA PCR Screening     Status: None   Collection Time: 01/22/19  2:57 AM   Specimen: Nasal Mucosa; Nasopharyngeal  Result Value Ref Range Status   MRSA by PCR NEGATIVE NEGATIVE Final    Comment:        The GeneXpert MRSA Assay (FDA approved for NASAL specimens only), is one component of a comprehensive MRSA colonization surveillance program. It is not intended to diagnose MRSA infection nor to guide or monitor treatment for MRSA infections. Performed at [facility], [place] [address], [place], [place] [address]          Radiology Studies: Dg Chest Port 1 View  Result Date: 01/27/2019 CLINICAL DATA:  Shortness of breath. COVID-19. EXAM: PORTABLE CHEST 1 VIEW COMPARISON:  01/23/2019 and 01/21/2019 and 01/27/2019 FINDINGS: There has been no change in the diffuse interstitial infiltrate in the right lung. Slight improvement in the interstitial infiltrate at the left lung base. No other change. Heart size and vascularity are normal. Feeding tube has been inserted and the tip is below the diaphragm. No bone abnormality. IMPRESSION: 1. No change in the diffuse interstitial infiltrate in the right lung. 2. Slight improvement in the interstitial infiltrate at the left lung base. Electronically Signed   By: Lorriane Shire M.D.   On: 01/27/2019 12:14        Scheduled Meds: . Chlorhexidine Gluconate Cloth  6 each Topical Daily  .  clonazepam  0.5 mg Oral BID  . cloNIDine  0.2 mg Transdermal Q Mon  . diltiazem  60 mg Per Tube Q6H  . enoxaparin (LOVENOX) injection  40 mg  Subcutaneous Q24H  . fluticasone furoate-vilanterol  1 puff Inhalation Daily  . free water  200 mL Per Tube Q8H  . insulin aspart  0-15 Units Subcutaneous Q4H  . mouth rinse  15 mL Mouth Rinse BID  . methylPREDNISolone (SOLU-MEDROL) injection  60 mg Intravenous Q12H  . nystatin  5 mL Oral QID  . [START ON 01/29/2019] pantoprazole  40 mg Oral Daily  . sodium polystyrene  15 g Oral Once  . vitamin C  500 mg Oral Daily  . zinc sulfate  220 mg Oral Daily   Continuous Infusions: . ampicillin-sulbactam (UNASYN) IV Stopped (01/28/19 1414)  . dextrose 75 mL/hr at 01/28/19 1700  . feeding supplement (GLUCERNA 1.2 CAL) 30 mL/hr at 01/28/19 1700     LOS: 9 days   The patient is critically ill with multiple organ systems failure and requires high complexity decision making for assessment and support, frequent evaluation and titration of therapies, application of advanced monitoring technologies and extensive interpretation of multiple databases. Critical Care Time devoted to patient care services described in this note  Time spent: 40 minutes     WOODS, Roselind MessierURTIS J, MD [facility] Pager [phone]  If 7PM-7AM, please contact night-coverage [email or web address] Password [email or web address] 01/28/2019, 5:38 PM

## 2019-01-28 NOTE — Plan of Care (Signed)
Patient unable to participate in teaching at this time d/t medical acuity. Teaching reinforced with patient's daughter via phone. Emotional support provided to patient and family. Remains on HHFNC + NRB at this time with SpO2 84-95%. Attempting to wean O2 as tolerated. Placed in prone position at 12:00. Will attempt to leave patient proned x16hours if tolerated per Dr. Sherral Hammers. Discharge planning ongoing, patient not appropriate for d/c at this time. Labs per results tab. HR and BP stable. Cortrack remains in place for nutritional support via TF and admin of medications. Incontinent of urine, purewick in place. Rectal tube with +loose stools. No s/s of pain noted at this time. Has PRNs available if needed. Safe environment of care maintained. Skin with scattered bruising and moisture issues in peri area. Peri care provided. T&R Q2H to prevent breakdown. Bony prominences supported.IV abx as ordered. Not OOB d/t high O2 requirements. Passive ROM completed. Will continue to monitor.

## 2019-01-28 NOTE — Progress Notes (Signed)
RT attempted to take NRB off pt but spo2 dropped to < 80% so NRB placed back on.

## 2019-01-29 DIAGNOSIS — J430 Unilateral pulmonary emphysema [MacLeod's syndrome]: Secondary | ICD-10-CM

## 2019-01-29 DIAGNOSIS — U071 COVID-19: Secondary | ICD-10-CM | POA: Diagnosis not present

## 2019-01-29 DIAGNOSIS — J690 Pneumonitis due to inhalation of food and vomit: Secondary | ICD-10-CM | POA: Diagnosis not present

## 2019-01-29 DIAGNOSIS — J9601 Acute respiratory failure with hypoxia: Secondary | ICD-10-CM | POA: Diagnosis not present

## 2019-01-29 LAB — CBC WITH DIFFERENTIAL/PLATELET
Abs Immature Granulocytes: 0.11 10*3/uL — ABNORMAL HIGH (ref 0.00–0.07)
Basophils Absolute: 0.1 10*3/uL (ref 0.0–0.1)
Basophils Relative: 1 %
Eosinophils Absolute: 0 10*3/uL (ref 0.0–0.5)
Eosinophils Relative: 0 %
HCT: 38.4 % (ref 36.0–46.0)
Hemoglobin: 12.5 g/dL (ref 12.0–15.0)
Immature Granulocytes: 1 %
Lymphocytes Relative: 3 %
Lymphs Abs: 0.3 10*3/uL — ABNORMAL LOW (ref 0.7–4.0)
MCH: 30.2 pg (ref 26.0–34.0)
MCHC: 32.6 g/dL (ref 30.0–36.0)
MCV: 92.8 fL (ref 80.0–100.0)
Monocytes Absolute: 0.7 10*3/uL (ref 0.1–1.0)
Monocytes Relative: 7 %
Neutro Abs: 9 10*3/uL — ABNORMAL HIGH (ref 1.7–7.7)
Neutrophils Relative %: 88 %
Platelets: 189 10*3/uL (ref 150–400)
RBC: 4.14 MIL/uL (ref 3.87–5.11)
RDW: 15 % (ref 11.5–15.5)
WBC: 10.1 10*3/uL (ref 4.0–10.5)
nRBC: 0.5 % — ABNORMAL HIGH (ref 0.0–0.2)

## 2019-01-29 LAB — C-REACTIVE PROTEIN: CRP: 1.2 mg/dL — ABNORMAL HIGH (ref <1.0)

## 2019-01-29 LAB — COMPREHENSIVE METABOLIC PANEL
ALT: 45 U/L — ABNORMAL HIGH (ref 0–44)
AST: 37 U/L (ref 15–41)
Albumin: 2.6 g/dL — ABNORMAL LOW (ref 3.5–5.0)
Alkaline Phosphatase: 134 U/L — ABNORMAL HIGH (ref 38–126)
Anion gap: 10 (ref 5–15)
BUN: 51 mg/dL — ABNORMAL HIGH (ref 8–23)
CO2: 23 mmol/L (ref 22–32)
Calcium: 7.8 mg/dL — ABNORMAL LOW (ref 8.9–10.3)
Chloride: 112 mmol/L — ABNORMAL HIGH (ref 98–111)
Creatinine, Ser: 1.04 mg/dL — ABNORMAL HIGH (ref 0.44–1.00)
GFR calc Af Amer: >60 mL/min (ref >60)
GFR calc non Af Amer: 53 mL/min — ABNORMAL LOW (ref >60)
Glucose, Bld: 211 mg/dL — ABNORMAL HIGH (ref 70–99)
Potassium: 4 mmol/L (ref 3.5–5.1)
Sodium: 145 mmol/L (ref 135–145)
Total Bilirubin: 0.9 mg/dL (ref 0.3–1.2)
Total Protein: 5.2 g/dL — ABNORMAL LOW (ref 6.5–8.1)

## 2019-01-29 LAB — PHOSPHORUS: Phosphorus: 4.3 mg/dL (ref 2.5–4.6)

## 2019-01-29 LAB — LACTATE DEHYDROGENASE: LDH: 415 U/L — ABNORMAL HIGH (ref 98–192)

## 2019-01-29 LAB — GLUCOSE, CAPILLARY
Glucose-Capillary: 174 mg/dL — ABNORMAL HIGH (ref 70–99)
Glucose-Capillary: 251 mg/dL — ABNORMAL HIGH (ref 70–99)
Glucose-Capillary: 163 mg/dL — ABNORMAL HIGH (ref 70–99)
Glucose-Capillary: 179 mg/dL — ABNORMAL HIGH (ref 70–99)

## 2019-01-29 LAB — D-DIMER, QUANTITATIVE: D-Dimer, Quant: 2 ug/mL-FEU — ABNORMAL HIGH (ref 0.00–0.50)

## 2019-01-29 LAB — MAGNESIUM: Magnesium: 2.8 mg/dL — ABNORMAL HIGH (ref 1.7–2.4)

## 2019-01-29 LAB — FERRITIN: Ferritin: 465 ng/mL — ABNORMAL HIGH (ref 11–307)

## 2019-01-29 MED ORDER — CHLORHEXIDINE GLUCONATE 0.12 % MT SOLN
15.0000 mL | Freq: Two times a day (BID) | OROMUCOSAL | Status: DC
  Administered 2019-01-29 (×2): 15 mL via OROMUCOSAL
  Filled 2019-01-29 (×3): qty 15

## 2019-01-29 MED ORDER — ORAL CARE MOUTH RINSE
15.0000 mL | Freq: Two times a day (BID) | OROMUCOSAL | Status: DC
  Administered 2019-01-29 (×2): 15 mL via OROMUCOSAL

## 2019-01-29 MED ORDER — INSULIN ASPART 100 UNIT/ML ~~LOC~~ SOLN
0.0000 [IU] | SUBCUTANEOUS | Status: DC
  Administered 2019-01-29: 21:00:00 4 [IU] via SUBCUTANEOUS

## 2019-01-29 NOTE — Progress Notes (Addendum)
PROGRESS NOTE    Ashley Cook  WUJ:[id number] DOB: [birth year] DOA: Jan 22, 2019 PCP: Koren Shiver, DO   Brief Narrative:  73 year old female PMHx    presented with dyspnea and cough. She does have significant past medical history of asthma, and COPD. She tested positive for COVID-19 November 15, she has been symptomatic with dyspnea, cough, fatigue and body aches, she had the symptoms for 3 to 4 days and when she became extremely short of breath she came to the ER where she was diagnosed with acute hypoxic respiratory failure secondary to COVID-19 pneumonia she was placed on 4 L oxygen and was started on appropriate treatment and sent to [facility].   Subjective: 11/29 last 24 hours afebrile, patient was moaning uncomfortably until she was turned onto her left side breathing much easier and no further moaning.  SPO2 improved   Assessment & Plan:   Principal Problem:   Pneumonia due to COVID-19 virus Active Problems:   Acute respiratory failure with hypoxia (HCC)   HLD (hyperlipidemia)   COPD (chronic obstructive pulmonary disease) (HCC)   COVID-19   Prediabetes   Toxic metabolic encephalopathy   Unspecified atrial fibrillation (HCC)   Moderate protein-calorie malnutrition (HCC)   Aspiration pneumonia (HCC)  Covid pneumonia/acute respiratory failure with hypoxia COVID-19 Labs  Recent Labs    01/27/19 0504 01/27/19 0505 01/28/19 0542 01/29/19 0600  DDIMER 1.81*  --  1.90* 2.00*  FERRITIN  --   --   --  465*  LDH  --   --   --  415*  CRP  --  0.8 0.8 1.2*    Lab Results  Component Value Date   SARSCOV2NAA POSITIVE (A) 01-22-2019   SARSCOV2NAA Not Detected 01/11/2019  -she has severe disease and unfortunately she waited at home for several days with shortness of breath before presenting to the hospital -Completed Remdesivir per pharmacy -Solu-Medrol 60 mg BID -11/21 Covid convalescent plasma -11/22 Actemra x1 -Titrate O2 to maintain SPO2> 88% -Combivent if  patient can cooperate -Flutter valve if patient can cooperate -Incentive spirometry if patient can cooperate -11/27 PCXR; slight improvement left lung base see results below -Prone patient 16 hours/day; if patient cannot tolerate prone 2 to 3 hours per shift -Morphine PRN air hunger -Ativan PRN anxiety  Aspiration pneumonia -Patient most likely microaspirating -Continue Unasyn x7 days -Trend procalcitonin Results for Ashley Cook, Ashley Cook (MRN [id number]) as of 01/28/2019 17:05  Ref. Range 01/23/2019 05:00 01/24/2019 05:30 01/25/2019 05:00 01/26/2019 07:35 01/27/2019 05:04  Procalcitonin Latest Units: ng/mL 0.24 <0.10 <0.10 0.10 0.18  11/30 procalcitonin pending  COPD -See Covid pneumonia  Hypernatremia -11/25 Albumin 25 g.  Administer prior to starting D5W -11/25 D5W 50 ml/hr   -11/28 increase free water 200 ml QID    Hypokalemia -Potassium goal> 4  A. Fib -11/26 currently NSR -11/26 DC IV Cardizem -11/26 Cardizem p.o 60 mg QID.   Elevated proBNP -Intravascularly dehydrated with third spacing -Hold Lasix. -See hypernatremia  Toxic metabolic encephalopathy -Correct underlying disorder -Hold all sedating medication.  Except those used for proning patient  HLD -Restart statin once CoreTtrak placed  Moderate protein calorie malnutrition -Elevate head of bed> 30 degrees.  Aspiration precautions -Tube feeds Glucerna 1.2 Cal 50 ml/hr   Prediabetes vs diabetes type 2 controlled -11/28 hemoglobin A1c = 6.7  -/29 increase resistant SSI     DVT prophylaxis: Lovenox Code Status: DNR Family Communication: 11/29 spoke with Alvino Chapel (daughter) and husband explained plan of care answered all questions.    Disposition Plan:  TBD   Consultants:  PCCM   Procedures/Significant Events:  11/21 Covid convalescent plasma 11/22 Actemra x1 11/27 PCXR;.-No change in the diffuse interstitial infiltrate in the right Lung. -Slight improvement in the interstitial infiltrate at the  left lung base.    I have personally reviewed and interpreted all radiology studies and my findings are as above.  VENTILATOR SETTINGS: HF [place] 11/29 Flow; 40 L/min FiO2; 100% SPO2; 94%    Cultures   Antimicrobials: Anti-infectives (From admission, onward)   Start     Stop   01/23/19 1100  Ampicillin-Sulbactam (UNASYN) 3 g in sodium chloride 0.9 % 100 mL IVPB         01/21/19 1000  remdesivir 100 mg in sodium chloride 0.9 % 250 mL IVPB  Status:  Discontinued     01/20/19 1015   01/21/19 1000  azithromycin (ZITHROMAX) tablet 500 mg  Status:  Discontinued     01/23/19 1028   01/20/19 1800  fluconazole (DIFLUCAN) tablet 150 mg     01/20/19 1828   01/20/19 1600  remdesivir 100 mg in sodium chloride 0.9 % 250 mL IVPB     01/23/19 1201   01/20/19 0015  remdesivir 200 mg in sodium chloride 0.9 % 250 mL IVPB     01/20/19 0141   02/10/19 2045  cefTRIAXone (ROCEPHIN) 2 g in sodium chloride 0.9 % 100 mL IVPB  Status:  Discontinued     01/23/19 1028   02-10-2019 2045  azithromycin (ZITHROMAX) 500 mg in sodium chloride 0.9 % 250 mL IVPB  Status:  Discontinued     01/21/19 0925       Devices    LINES / TUBES:      Continuous Infusions:  ampicillin-sulbactam (UNASYN) IV 3 g (01/29/19 0757)   dextrose 50 mL/hr at 01/28/19 2332   feeding supplement (GLUCERNA 1.2 CAL) 40 mL/hr at 01/29/19 0400     Objective: Vitals:   01/29/19 0500 01/29/19 0600 01/29/19 0700 01/29/19 0800  BP: (!) 149/54 121/76 (!) 141/58 (!) 142/58  Pulse: 86 81 80 83  Resp: (!) 28 (!) 27 (!) 25 (!) 35  Temp:    98.5 F (36.9 C)  TempSrc:    Axillary  SpO2: 95% 94% 94% (!) 89%  Weight: 71.4 kg     Height:        Intake/Output Summary (Last 24 hours) at 01/29/2019 0913 Last data filed at 01/29/2019 0700 Gross per 24 hour  Intake 2384.44 ml  Output 1750 ml  Net 634.44 ml   Filed Weights   February 10, 2019 1910 01/27/19 0500 01/29/19 0500  Weight: 47.6 kg 66.9 kg 71.4 kg    Physical  Exam:  General: Somnolent, initially moaning uncomfortable until turned on her side, positive acute respiratory distress, cachectic Eyes: negative scleral hemorrhage, negative anisocoria, negative icterus ENT: Negative Runny nose, negative gingival bleeding, Neck:  Negative scars, masses, torticollis, lymphadenopathy, JVD Lungs: Tachypneic diffuse rhonchi, positive productive cough (yellow sputum) without wheezes or crackles Cardiovascular: Regular rate and rhythm without murmur gallop or rub normal S1 and S2 Abdomen: negative abdominal pain, nondistended, positive soft, bowel sounds, no rebound, no ascites, no appreciable mass Extremities: No significant cyanosis, clubbing, or edema bilateral lower extremities Skin: Negative rashes, lesions, ulcers Psychiatric: Unable to evaluate altered mental status Central nervous system: Unable to evaluate altered mental status      .     Data Reviewed: Care during the described time interval was provided by me .  I have reviewed this patient's  available data, including medical history, events of note, physical examination, and all test results as part of my evaluation.   CBC: Recent Labs  Lab 01/25/19 0500 01/26/19 0735 01/27/19 0504 01/28/19 0542 01/29/19 0600  WBC 16.4* 13.8* 13.6* 11.2* 10.1  NEUTROABS 15.0* 12.9* 12.7* 10.4* 9.0*  HGB 13.6 12.8 12.8 13.2 12.5  HCT 42.5 38.8 38.8 40.3 38.4  MCV 91.6 91.3 91.7 91.8 92.8  PLT 329 246 210 177 189   Basic Metabolic Panel: Recent Labs  Lab 01/25/19 0500 01/25/19 1552 01/26/19 0735 01/26/19 1650 01/27/19 0504 01/27/19 1655 01/28/19 0542 01/29/19 0600  NA 151*  --  148*  --  151*  --  149* 145  K 3.6  --  3.5  --  4.0  --  3.5 4.0  CL 116*  --  114*  --  117*  --  116* 112*  CO2 19*  --  20*  --  18*  --  21* 23  GLUCOSE 240*  --  227*  --  234*  --  200* 211*  BUN 41*  --  37*  --  36*  --  35* 51*  CREATININE 0.94  --  0.91  --  0.85  --  0.73 1.04*  CALCIUM 8.1*  --  8.1*   --  8.3*  --  8.2* 7.8*  MG 2.5* 2.5* 2.5* 2.7* 2.7* 2.8* 2.7* 2.8*  PHOS 2.9 2.7 2.7  --  2.5  --  3.3 4.3   GFR: Estimated Creatinine Clearance: 45 mL/min (A) (by C-G formula based on SCr of 1.04 mg/dL (H)). Liver Function Tests: Recent Labs  Lab 01/25/19 0500 01/26/19 0735 01/27/19 0504 01/28/19 0542 01/29/19 0600  AST 32 25 31 27  37  ALT 35 29 30 46* 45*  ALKPHOS 118 132* 125 136* 134*  BILITOT 0.7 0.8 0.9 0.6 0.9  PROT 6.2* 6.0* 5.8* 5.6* 5.2*  ALBUMIN 2.8* 3.0* 2.9* 2.7* 2.6*   No results for input(s): LIPASE, AMYLASE in the last 168 hours. No results for input(s): AMMONIA in the last 168 hours. Coagulation Profile: No results for input(s): INR, PROTIME in the last 168 hours. Cardiac Enzymes: No results for input(s): CKTOTAL, CKMB, CKMBINDEX, TROPONINI in the last 168 hours. BNP (last 3 results) No results for input(s): PROBNP in the last 8760 hours. HbA1C: Recent Labs    01/27/19 0504 01/28/19 0542  HGBA1C 6.5* 6.7*   CBG: Recent Labs  Lab 01/28/19 1610 01/28/19 1957 01/28/19 2346 01/29/19 0452 01/29/19 0726  GLUCAP 192* 226* 208* 174* 251*   Lipid Profile: No results for input(s): CHOL, HDL, LDLCALC, TRIG, CHOLHDL, LDLDIRECT in the last 72 hours. Thyroid Function Tests: No results for input(s): TSH, T4TOTAL, FREET4, T3FREE, THYROIDAB in the last 72 hours. Anemia Panel: Recent Labs    01/29/19 0600  FERRITIN 465*   Urine analysis: No results found for: COLORURINE, APPEARANCEUR, LABSPEC, PHURINE, GLUCOSEU, HGBUR, BILIRUBINUR, KETONESUR, PROTEINUR, UROBILINOGEN, NITRITE, LEUKOCYTESUR Sepsis Labs: @LABRCNTIP (procalcitonin:4,lacticidven:4)  ) Recent Results (from the past 240 hour(s))  Blood Culture (routine x 2)     Status: None   Collection Time: 01/08/2019  7:34 PM   Specimen: BLOOD  Result Value Ref Range Status   Specimen Description   Final    BLOOD LEFT ANTECUBITAL Performed at [facility], [address]  [place], [place] [id number]    Special Requests   Final    BOTTLES DRAWN AEROBIC AND ANAEROBIC Blood Culture adequate volume Performed at [facility],  [place] [address] [place], [place] [address]    Culture   Final    NO GROWTH 5 DAYS Performed at [facility], [place] [address] [place], [place] [address]    Report Status 01/24/2019 FINAL  Final  Blood Culture (routine x 2)     Status: None   Collection Time: 02/17/2019  7:34 PM   Specimen: BLOOD LEFT FOREARM  Result Value Ref Range Status   Specimen Description   Final    BLOOD LEFT FOREARM Performed at [facility], [place] [address], [place], [place] [address]    Special Requests   Final    BOTTLES DRAWN AEROBIC AND ANAEROBIC Blood Culture results may not be optimal due to an inadequate volume of blood received in culture bottles Performed at [place] [address], [place], [place] [address]    Culture   Final    NO GROWTH 5 DAYS Performed at [facility], [place] [address], [place], [place] [address]    Report Status 01/24/2019 FINAL  Final  SARS CORONAVIRUS 2 (TAT 6-24 HRS) Nasopharyngeal Nasopharyngeal Swab     Status: Abnormal   Collection Time: 02/17/19 11:44 PM   Specimen: Nasopharyngeal Swab  Result Value Ref Range Status   SARS Coronavirus 2 POSITIVE (A) NEGATIVE Final    Comment: RESULT CALLED TO, READ BACK BY AND VERIFIED WITH: Marisa Hua RN 9:40 01/20/19 ([email or web address]) (NOTE) SARS-CoV-2 target nucleic acids are DETECTED. The SARS-CoV-2 RNA is generally detectable in upper and lower respiratory specimens during the acute phase of infection. Positive results are indicative of active infection with SARS-CoV-2. Clinical  correlation with patient history and other diagnostic information is necessary to determine patient infection status. Positive results do  not rule out bacterial infection or co-infection with other viruses. The expected result is Negative. Fact Sheet for  Patients: SugarRoll.be Fact Sheet for Healthcare Providers: https://www.-mathews.com/ This test is not yet approved or cleared by the [place] FDA and  has been authorized for detection and/or diagnosis of SARS-CoV-2 by FDA under an Emergency Use Authorization (EUA). This EUA will remain  in effect (meaning this test can be used) for  the duration of the COVID-19 declaration under Section 564(b)(1) of the Act, 21 U.S.C. section 360bbb-3(b)(1), unless the authorization is terminated or revoked sooner. Performed at [facility], [place] [address], [place], [place] [address]   MRSA PCR Screening     Status: None   Collection Time: 01/22/19  2:57 AM   Specimen: Nasal Mucosa; Nasopharyngeal  Result Value Ref Range Status   MRSA by PCR NEGATIVE NEGATIVE Final    Comment:        The GeneXpert MRSA Assay (FDA approved for NASAL specimens only), is one component of a comprehensive MRSA colonization surveillance program. It is not intended to diagnose MRSA infection nor to guide or monitor treatment for MRSA infections. Performed at [facility], [place] [address], [place], [place] [address]          Radiology Studies: Dg Chest Port 1 View  Result Date: 01/27/2019 CLINICAL DATA:  Shortness of breath. COVID-19. EXAM: PORTABLE CHEST 1 VIEW COMPARISON:  01/23/2019 and 01/21/2019 and 2019/02/17 FINDINGS: There has been no change in the diffuse interstitial infiltrate in the right lung. Slight improvement in the interstitial infiltrate at the left lung base. No other change. Heart size and vascularity are normal. Feeding tube has been inserted and the tip is below the diaphragm. No bone  abnormality. IMPRESSION: 1. No change in the diffuse interstitial infiltrate in the right lung. 2. Slight improvement in the interstitial infiltrate at the left lung base. Electronically Signed   By: Francene Boyers M.D.   On: 01/27/2019  12:14        Scheduled Meds:  chlorhexidine  15 mL Mouth Rinse BID   Chlorhexidine Gluconate Cloth  6 each Topical Daily   clonazepam  0.5 mg Oral BID   cloNIDine  0.2 mg Transdermal Q Mon   diltiazem  60 mg Per Tube Q6H   enoxaparin (LOVENOX) injection  40 mg Subcutaneous Q24H   fluticasone furoate-vilanterol  1 puff Inhalation Daily   free water  200 mL Per Tube Q8H   insulin aspart  0-15 Units Subcutaneous Q4H   mouth rinse  15 mL Mouth Rinse q12n4p   methylPREDNISolone (SOLU-MEDROL) injection  60 mg Intravenous Q12H   nystatin  5 mL Oral QID   pantoprazole  40 mg Oral Daily   sodium polystyrene  15 g Oral Once   vitamin C  500 mg Oral Daily   zinc sulfate  220 mg Oral Daily   Continuous Infusions:  ampicillin-sulbactam (UNASYN) IV 3 g (01/29/19 0757)   dextrose 50 mL/hr at 01/28/19 2332   feeding supplement (GLUCERNA 1.2 CAL) 40 mL/hr at 01/29/19 0400     LOS: 10 days   The patient is critically ill with multiple organ systems failure and requires high complexity decision making for assessment and support, frequent evaluation and titration of therapies, application of advanced monitoring technologies and extensive interpretation of multiple databases. Critical Care Time devoted to patient care services described in this note  Time spent: 40 minutes     Joell Buerger, Roselind Messier, MD [facility] Pager [phone]  If 7PM-7AM, please contact night-coverage [email or web address] Password [facility] 01/29/2019, 9:13 AM

## 2019-01-29 NOTE — Progress Notes (Signed)
Ms. Ashley Cook is a 73 year old woman with COVID-19 severe viral pneumonia.  She is remained in the ICU for several days on heated high flow nasal cannula, sometimes intermittently with a nonrebreather.  She tolerated prone positioning well with improvement in her saturations.  Nonlabored respirations.  Remains encephalopathic.  Continue current management  Julian Hy, DO 01/29/19 9:13 PM [facility]

## 2019-01-29 NOTE — Progress Notes (Signed)
Updated patients daughter and son earlier and facilitated video call. Ashley Cook

## 2019-01-29 NOTE — Progress Notes (Signed)
Assisted tele visit to patient with family member.  Anisia Leija M, RN  

## 2019-01-29 NOTE — Progress Notes (Signed)
OT Cancellation Note  Patient Details Name: Jayana Kotula MRN: [id number] DOB: 01-Sep-1945   Cancelled Treatment:    Reason Eval/Treat Not Completed: Patient not medically ready. Nsg reports pt struggling for air with any movement. Will assess when medically appropriate.   Jamiyla Ishee,HILLARY 01/29/2019, 8:35 AM  Maurie Boettcher, OT/L   Acute OT Clinical Specialist Acute Rehabilitation Services Pager [phone] Office [phone]

## 2019-01-29 NOTE — Progress Notes (Signed)
At 2220, Ashley Cook's sats dropped to 58%. After suctioning thick, yellow secretions from the back of her throat and repositioning her on her side, her sats remained in the low 70s. Notified RT and eLink. Spoke with Dr Emmit Alexanders, who recommended TRH call the family regarding comfort care. Paged [place] and awaiting response

## 2019-01-30 MED ORDER — GLYCOPYRROLATE 0.2 MG/ML IJ SOLN: 0.2000 mg | INTRAMUSCULAR | Status: DC | PRN

## 2019-01-30 MED ORDER — POLYVINYL ALCOHOL 1.4 % OP SOLN
1.0000 [drp] | Freq: Four times a day (QID) | OPHTHALMIC | Status: DC | PRN
  Filled 2019-01-30: qty 15

## 2019-01-30 MED ORDER — BIOTENE DRY MOUTH MT LIQD: 15.0000 mL | OROMUCOSAL | Status: DC | PRN

## 2019-01-30 MED ORDER — GLYCOPYRROLATE 1 MG PO TABS
1.0000 mg | ORAL_TABLET | ORAL | Status: DC | PRN
  Filled 2019-01-30: qty 1

## 2019-01-30 MED ORDER — HALOPERIDOL LACTATE 2 MG/ML PO CONC
0.5000 mg | ORAL | Status: DC | PRN
  Filled 2019-01-30: qty 0.3

## 2019-01-30 MED ORDER — LORAZEPAM 2 MG/ML IJ SOLN: 1.0000 mg | INTRAMUSCULAR | Status: DC | PRN

## 2019-01-30 MED ORDER — LORAZEPAM 0.5 MG PO TABS: 1.0000 mg | ORAL_TABLET | ORAL | Status: DC | PRN

## 2019-01-30 MED ORDER — HALOPERIDOL LACTATE 5 MG/ML IJ SOLN: 0.5000 mg | INTRAMUSCULAR | Status: DC | PRN

## 2019-01-30 MED ORDER — LORAZEPAM 2 MG/ML PO CONC: 1.0000 mg | ORAL | Status: DC | PRN

## 2019-01-30 MED ORDER — HALOPERIDOL 0.5 MG PO TABS
0.5000 mg | ORAL_TABLET | ORAL | Status: DC | PRN
  Filled 2019-01-30: qty 1

## 2019-01-31 NOTE — Progress Notes (Signed)
Pt's daughter and son-in-law came in to see her. Answered all their questions and let them know that we will keep Ashley Cook comfortable.

## 2019-01-31 NOTE — Death Summary Note (Signed)
Death Summary  Rital Cavey DXA:[id number] DOB: [birth year] DOA: February 18, 2019  PCP: Lyman Bishop, DO PCP/Office notified:no  Admit date: 18-Feb-2019 Date of Death: 03-01-19  Final Diagnoses:  Principal Problem:   Pneumonia due to COVID-19 virus Active Problems:   Acute respiratory failure with hypoxia (HCC)   HLD (hyperlipidemia)   COPD (chronic obstructive pulmonary disease) ([place])   COVID-19   Prediabetes   Toxic metabolic encephalopathy   Unspecified atrial fibrillation (HCC)   Moderate protein-calorie malnutrition (HCC)   Aspiration pneumonia ([place])   Covid pneumonia/acute respiratory failure with hypoxia COVID-19 Labs  Recent Labs (last 2 labs)         Recent Labs    01/27/19 0504 01/27/19 0505 01/28/19 0542 01/29/19 0600  DDIMER 1.81*  --  1.90* 2.00*  FERRITIN  --   --   --  465*  LDH  --   --   --  415*  CRP  --  0.8 0.8 1.2*      Recent Labs       Lab Results  Component Value Date   SARSCOV2NAA POSITIVE (A) 2019/02/18   [place] Not Detected 01/11/2019    -she has severe disease and unfortunately she waited at home for several days with shortness of breath before presenting to the hospital -Completed Remdesivir per pharmacy -Solu-Medrol 60 mg BID -11/21 Covid convalescent plasma -11/22 Actemra x1 -Titrate O2 to maintain SPO2> 88% -Combivent if patient can cooperate -Flutter valve if patient can cooperate -Incentive spirometry if patient can cooperate -11/27 PCXR; slight improvement left lung base see results below -Prone patient 16 hours/day; if patient cannot tolerate prone 2 to 3 hours per shift -Morphine PRN air hunger -Ativan PRN anxiety  Aspiration pneumonia -Patient most likely microaspirating -Continue Unasyn x7 days -Trend procalcitonin Results for DAYLYN, CHRISTINE (MRN [id number]) as of 01/28/2019 17:05  Ref. Range 01/23/2019 05:00 01/24/2019 05:30 01/25/2019 05:00 01/26/2019 07:35 01/27/2019 05:04  Procalcitonin  Latest Units: ng/mL 0.24 <0.10 <0.10 0.10 0.18  Mar 01, 2023 procalcitonin pending  COPD -See Covid pneumonia  Hypernatremia -11/25 Albumin 25 g.  Administer prior to starting D5W -11/25 D5W 50 ml/hr   -11/28 increase free water 200 ml QID    Hypokalemia -Potassium goal> 4  A. Fib -11/26 currently NSR -11/26 DC IV Cardizem -11/26 Cardizem p.o 60 mg QID.   Elevated proBNP -Intravascularly dehydrated with third spacing -Hold Lasix. -See hypernatremia  Toxic metabolic encephalopathy -Correct underlying disorder -Hold all sedating medication.  Except those used for proning patient  HLD -Restart statin once CoreTtrak placed  Moderate protein calorie malnutrition -Elevate head of bed> 30 degrees.  Aspiration precautions -Tube feeds Glucerna 1.2 Cal 50 ml/hr   Prediabetes vs diabetes type 2 controlled -11/28 hemoglobin A1c = 6.7  -29 increase resistant SSI   History of present illness:  73 year old female PMHx    presented with dyspnea and cough. She does have significant past medical history of asthma, and COPD. She tested positive for COVID-19 November 15, she has been symptomatic with dyspnea, cough, fatigue and body aches, she had the symptoms for 3 to 4 days and when she became extremely short of breath she came to the ER where she was diagnosed with acute hypoxic respiratory failure secondary to COVID-19 pneumonia she was placed on 4 L oxygen and was started on appropriate treatment and sent to [facility]  Hospital Course:  Despite maximal treatment for Covid pneumonia/acute respiratory failure with hypoxia patient was unable to recover.   Time: 0 846  Signed:  Vicente Serene  Joseph Art, MD [facility] [phone]

## 2019-01-31 NOTE — Ethics Note (Addendum)
Called by the patient's nurse after Ashley Cook's oxygen saturation dropped to 58%, respiratory therapist had been notified, attempted suctioning, repositioning had only managed to improve air sats today lower 70s.  Case discussed with nurse and PCCM who recommended family discussion and consideration for comfort care measures.  Patient is DNR/DNI.  Patient was assessed at bedside, phone call made to patient's POA Ashley Cook at [phone].  After prolonged discussion and consideration of all options the patient's family has agreed to initiate comfort care measures.  Comfort care measures will be ordered per protocol.  Approximately 25 minutes was spent discussing advance care directives with the patient next of kin. [place]

## 2019-01-31 NOTE — Progress Notes (Addendum)
Pt's time of death was 0846 on 2019/02/01

## 2019-01-31 NOTE — Progress Notes (Signed)
SLP Cancellation Note  Patient Details Name: Ashley Cook MRN: [id number] DOB: 04-05-1945   Cancelled treatment:       Reason Eval/Treat Not Completed: Other (comment) Pt now comfort care. SLP to sign off. Please reorder if we can assist.    Talbert Nan 01/23/2019, 7:49 AM  Pollyann Glen, M.A. [place] Acute Environmental education officer [phone] Office [phone]

## 2019-01-31 NOTE — Progress Notes (Signed)
The patient's family spoke with Dr Maxwell Marion and decided to put Ashley Cook on comfort care. Coordinated a family visit with security and the [facility] at the family's request. The pt's son and daughter will be here at 3 am.

## 2019-01-31 NOTE — Progress Notes (Signed)
OT Cancellation Note  Patient Details Name: Ashley Cook MRN: [id number] DOB: 06-Jun-1945   Cancelled Treatment:    Reason Eval/Treat Not Completed: Patient not medically ready. Pt transitioned to comfort care. OT will sign off at this time.  Lyman Bishop 11-Feb-2019, 7:36 AM

## 2019-01-31 NOTE — Progress Notes (Signed)
PT Cancellation Note  Patient Details Name: Ashley Cook MRN: [id number] DOB: 14-Sep-1945   Cancelled Treatment:    Reason Eval/Treat Not Completed: PT screened, Noted patient is  For comfort care.   [name] 02/06/19, 7:20 AM  [place] Pager [phone] Office [phone]

## 2019-01-31 DEATH — deceased

## 2021-05-28 IMAGING — DX DG CHEST 1V PORT
1 series · 1 of 1 positions shown · non-contrast
Comparison: 01/23/2019 and 01/21/2019 and 01/19/2019

CLINICAL DATA: Shortness of breath. [id number].

EXAM:
PORTABLE CHEST 1 VIEW

[chest ap]
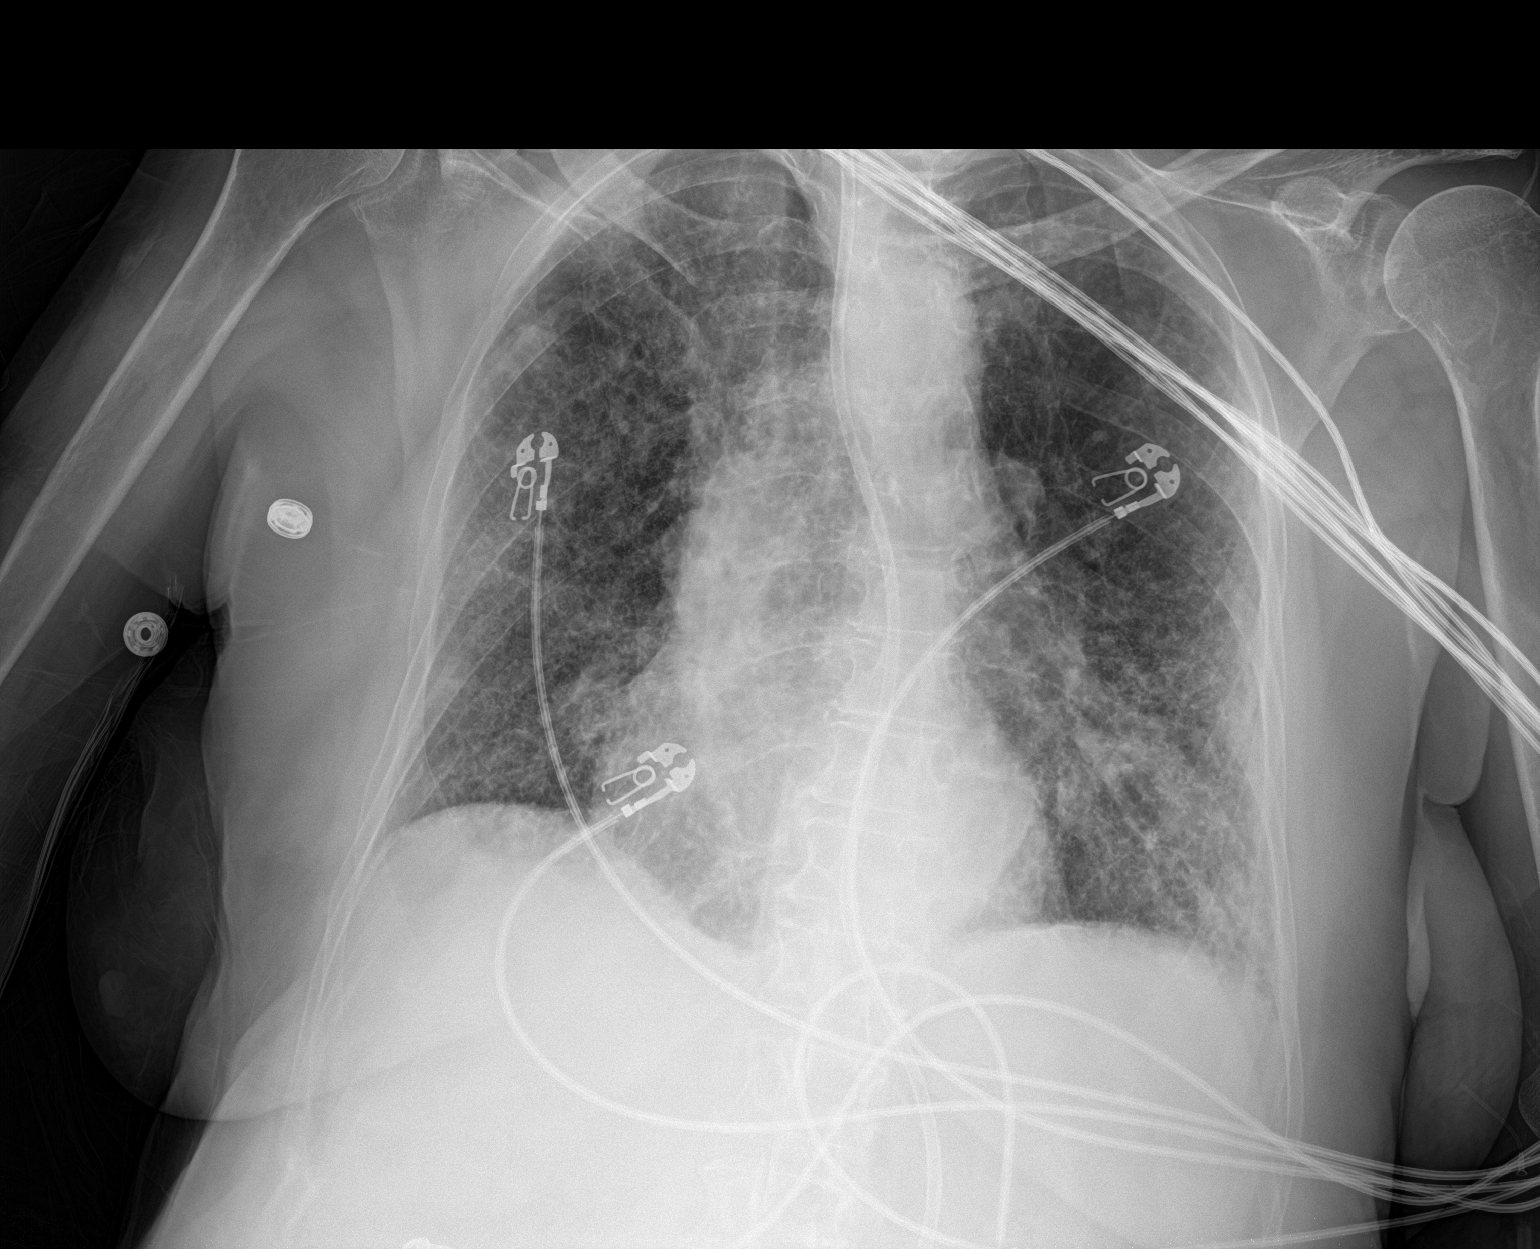

[1 of 1 positions shown; findings below may reference images not displayed]

FINDINGS: There has been no change in the diffuse interstitial infiltrate in
the right lung. Slight improvement in the interstitial infiltrate at
the left lung base. No other change. Heart size and vascularity are
normal.

Feeding tube has been inserted and the tip is below the diaphragm.
No bone abnormality.
IMPRESSION: 1. No change in the diffuse interstitial infiltrate in the right
lung.
2. Slight improvement in the interstitial infiltrate at the left
lung base.
